# Patient Record
Sex: Male | Born: 1935
Health system: Southern US, Community
[De-identification: ages and names within clinical notes are randomized; demographics above are authoritative.]

## PROBLEM LIST (undated history)

## (undated) DIAGNOSIS — E785 Hyperlipidemia, unspecified: Secondary | ICD-10-CM

## (undated) DIAGNOSIS — I350 Nonrheumatic aortic (valve) stenosis: Secondary | ICD-10-CM

## (undated) DIAGNOSIS — I5032 Chronic diastolic (congestive) heart failure: Secondary | ICD-10-CM

## (undated) DIAGNOSIS — I10 Essential (primary) hypertension: Secondary | ICD-10-CM

## (undated) DIAGNOSIS — I2510 Atherosclerotic heart disease of native coronary artery without angina pectoris: Secondary | ICD-10-CM

## (undated) HISTORY — DX: Atherosclerotic heart disease of native coronary artery without angina pectoris: I25.10

## (undated) HISTORY — DX: Hyperlipidemia, unspecified: E78.5

## (undated) HISTORY — PX: BACK SURGERY: SHX140

## (undated) HISTORY — DX: Essential (primary) hypertension: I10

## (undated) HISTORY — PX: CHOLECYSTECTOMY: SHX55

## (undated) HISTORY — DX: Nonrheumatic aortic (valve) stenosis: I35.0

---

## 2002-07-10 ENCOUNTER — Emergency Department (HOSPITAL_COMMUNITY): Admission: EM | Admit: 2002-07-10 | Discharge: 2002-07-10 | Payer: Self-pay | Admitting: Emergency Medicine

## 2002-09-05 ENCOUNTER — Encounter: Payer: Self-pay | Admitting: Neurosurgery

## 2002-09-07 ENCOUNTER — Inpatient Hospital Stay (HOSPITAL_COMMUNITY): Admission: RE | Admit: 2002-09-07 | Discharge: 2002-09-08 | Payer: Self-pay | Admitting: Neurosurgery

## 2002-09-07 ENCOUNTER — Encounter: Payer: Self-pay | Admitting: Neurosurgery

## 2009-02-28 ENCOUNTER — Encounter: Payer: Self-pay | Admitting: Cardiology

## 2009-03-02 ENCOUNTER — Encounter: Payer: Self-pay | Admitting: Cardiology

## 2009-03-05 ENCOUNTER — Encounter: Payer: Self-pay | Admitting: Cardiology

## 2009-03-07 ENCOUNTER — Ambulatory Visit: Payer: Self-pay | Admitting: Cardiology

## 2009-03-07 ENCOUNTER — Encounter: Payer: Self-pay | Admitting: Cardiology

## 2009-03-07 ENCOUNTER — Inpatient Hospital Stay (HOSPITAL_COMMUNITY): Admission: AD | Admit: 2009-03-07 | Discharge: 2009-03-20 | Payer: Self-pay | Admitting: Cardiology

## 2009-03-07 DIAGNOSIS — I10 Essential (primary) hypertension: Secondary | ICD-10-CM

## 2009-03-07 DIAGNOSIS — I359 Nonrheumatic aortic valve disorder, unspecified: Secondary | ICD-10-CM

## 2009-03-07 DIAGNOSIS — I209 Angina pectoris, unspecified: Secondary | ICD-10-CM

## 2009-03-07 HISTORY — PX: CARDIAC CATHETERIZATION: SHX172

## 2009-03-08 ENCOUNTER — Ambulatory Visit: Payer: Self-pay | Admitting: Surgery

## 2009-03-08 ENCOUNTER — Encounter: Payer: Self-pay | Admitting: Cardiology

## 2009-03-08 ENCOUNTER — Encounter: Payer: Self-pay | Admitting: Surgery

## 2009-03-10 ENCOUNTER — Encounter: Payer: Self-pay | Admitting: Cardiology

## 2009-03-14 ENCOUNTER — Encounter: Payer: Self-pay | Admitting: Surgery

## 2009-03-14 HISTORY — PX: AORTIC VALVE REPLACEMENT: SHX41

## 2009-03-14 HISTORY — PX: CORONARY ARTERY BYPASS GRAFT: SHX141

## 2009-03-30 DIAGNOSIS — I2510 Atherosclerotic heart disease of native coronary artery without angina pectoris: Secondary | ICD-10-CM

## 2009-03-30 DIAGNOSIS — E785 Hyperlipidemia, unspecified: Secondary | ICD-10-CM | POA: Insufficient documentation

## 2009-04-02 ENCOUNTER — Encounter: Payer: Self-pay | Admitting: Nurse Practitioner

## 2009-04-02 ENCOUNTER — Ambulatory Visit: Payer: Self-pay | Admitting: Internal Medicine

## 2009-04-09 ENCOUNTER — Ambulatory Visit: Payer: Self-pay | Admitting: Surgery

## 2009-04-09 ENCOUNTER — Encounter: Admission: RE | Admit: 2009-04-09 | Discharge: 2009-04-09 | Payer: Self-pay | Admitting: Surgery

## 2009-04-09 ENCOUNTER — Encounter: Payer: Self-pay | Admitting: Cardiology

## 2009-04-24 ENCOUNTER — Ambulatory Visit: Payer: Self-pay | Admitting: Surgery

## 2009-04-24 ENCOUNTER — Encounter: Admission: RE | Admit: 2009-04-24 | Discharge: 2009-04-24 | Payer: Self-pay | Admitting: Surgery

## 2009-04-26 ENCOUNTER — Encounter: Payer: Self-pay | Admitting: Cardiology

## 2009-04-27 ENCOUNTER — Encounter: Payer: Self-pay | Admitting: Cardiology

## 2009-05-07 ENCOUNTER — Telehealth: Payer: Self-pay | Admitting: Cardiology

## 2009-05-23 ENCOUNTER — Encounter: Payer: Self-pay | Admitting: Cardiology

## 2009-07-04 ENCOUNTER — Encounter: Payer: Self-pay | Admitting: Cardiology

## 2009-08-06 ENCOUNTER — Encounter: Payer: Self-pay | Admitting: Cardiology

## 2009-09-03 ENCOUNTER — Ambulatory Visit: Payer: Self-pay | Admitting: Cardiology

## 2009-09-03 DIAGNOSIS — I498 Other specified cardiac arrhythmias: Secondary | ICD-10-CM

## 2009-09-04 ENCOUNTER — Encounter (INDEPENDENT_AMBULATORY_CARE_PROVIDER_SITE_OTHER): Payer: Self-pay | Admitting: *Deleted

## 2009-09-17 ENCOUNTER — Ambulatory Visit: Payer: Self-pay

## 2009-09-17 ENCOUNTER — Ambulatory Visit: Payer: Self-pay | Admitting: Cardiology

## 2009-09-17 ENCOUNTER — Encounter: Payer: Self-pay | Admitting: Cardiology

## 2009-09-17 ENCOUNTER — Ambulatory Visit (HOSPITAL_COMMUNITY): Admission: RE | Admit: 2009-09-17 | Discharge: 2009-09-17 | Payer: Self-pay | Admitting: Cardiology

## 2010-03-04 ENCOUNTER — Ambulatory Visit: Payer: Self-pay | Admitting: Cardiology

## 2010-03-05 ENCOUNTER — Encounter (INDEPENDENT_AMBULATORY_CARE_PROVIDER_SITE_OTHER): Payer: Self-pay | Admitting: *Deleted

## 2010-03-28 ENCOUNTER — Ambulatory Visit: Payer: Self-pay

## 2010-03-28 ENCOUNTER — Ambulatory Visit: Payer: Self-pay | Admitting: Cardiovascular Disease

## 2010-03-28 ENCOUNTER — Ambulatory Visit (HOSPITAL_COMMUNITY): Admission: RE | Admit: 2010-03-28 | Discharge: 2010-03-28 | Payer: Self-pay | Admitting: Cardiology

## 2010-03-28 ENCOUNTER — Encounter: Payer: Self-pay | Admitting: Cardiology

## 2010-06-08 IMAGING — CR DG CHEST 1V PORT
1 series · 1 of 1 positions shown · non-contrast
Comparison: None available.

CLINICAL DATA: Unstable angina.

PORTABLE CHEST - 1 VIEW

[AP]
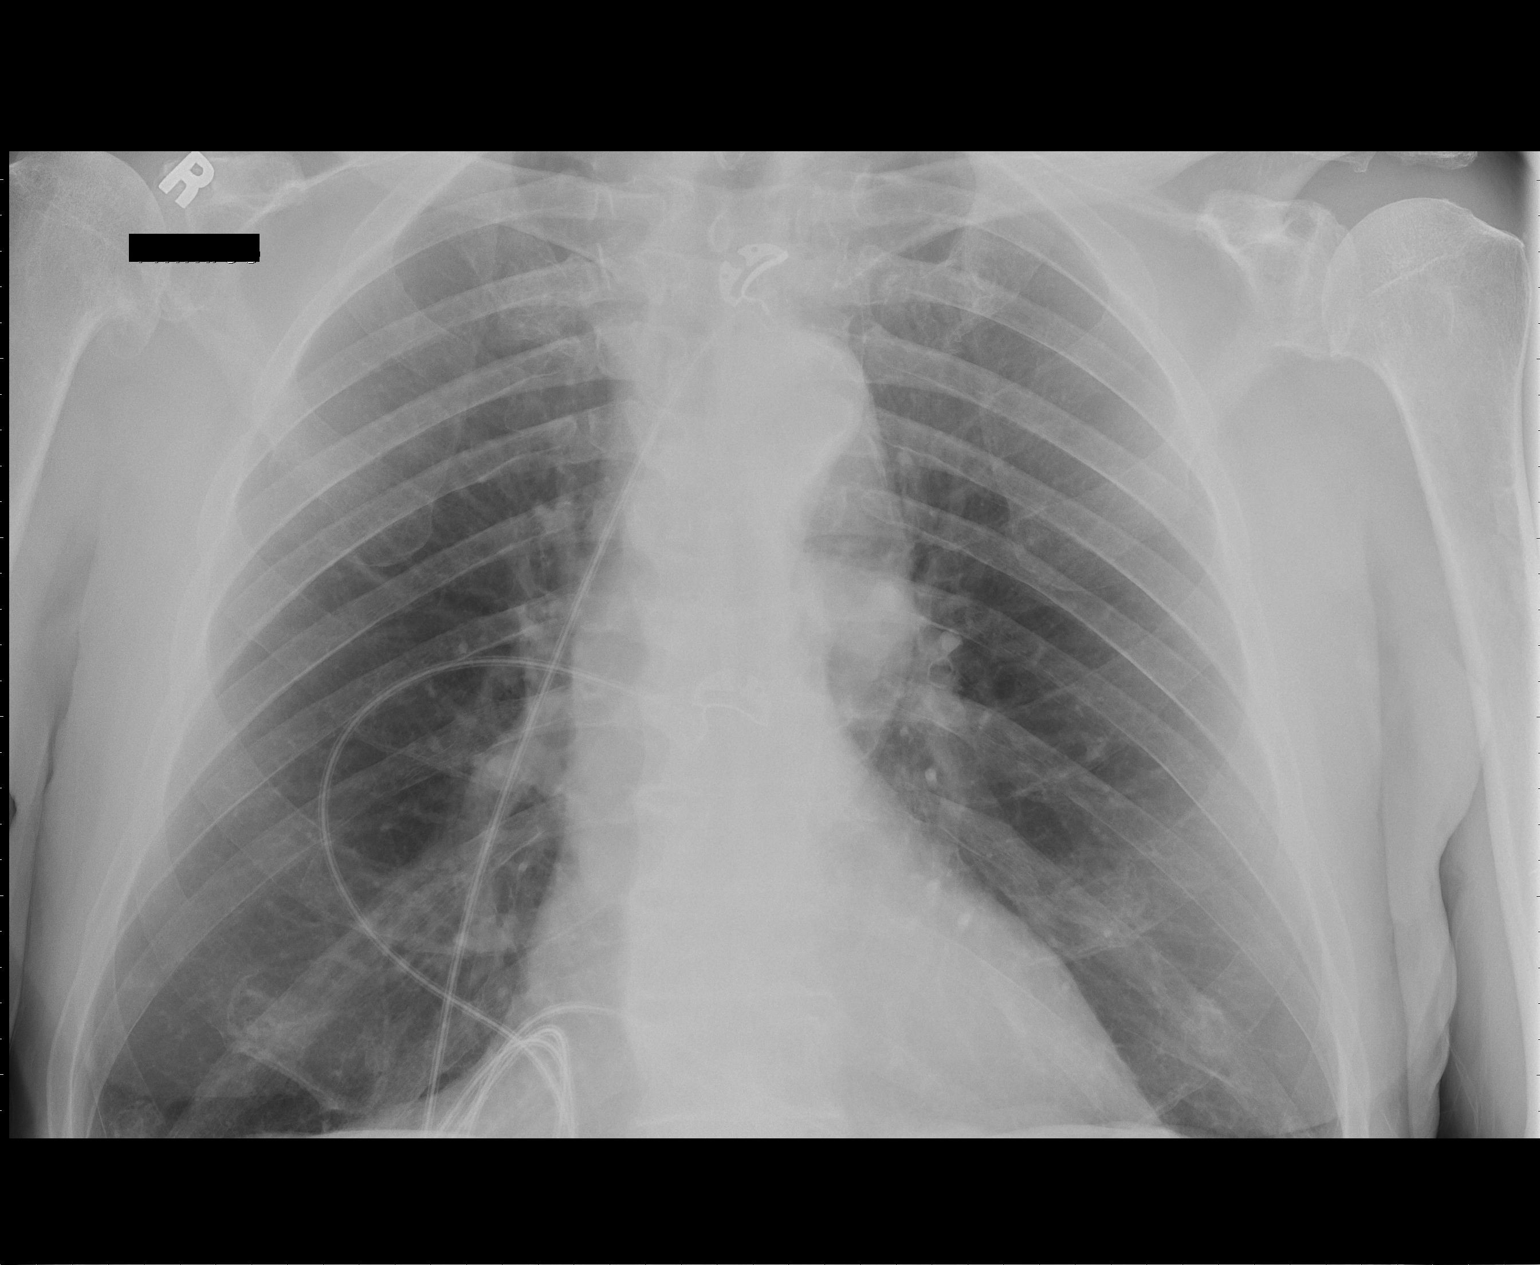

[1 of 1 positions shown; findings below may reference images not displayed]

FINDINGS: Cardiopericardial silhouette is within normal limits for
size.  Mild bronchitic changes are evident in the lung.  No focal
airspace disease is present.  There is no edema or effusion to
suggest failure.  Mild atherosclerotic calcifications are noted at
the aortic arch.  Degenerative changes are noted in the right
greater than left shoulder.
IMPRESSION: 1.  No acute cardiopulmonary disease.
2.  Mild bronchitic changes are evident.
3.  Degenerative changes of the shoulder, right greater than left.

## 2010-06-11 IMAGING — CR DG CHEST 2V
2 series · 2 of 2 positions shown · non-contrast
Comparison: 03/07/2009.

CLINICAL DATA: Unstable angina.

CHEST - 2 VIEW

[w chest pa]
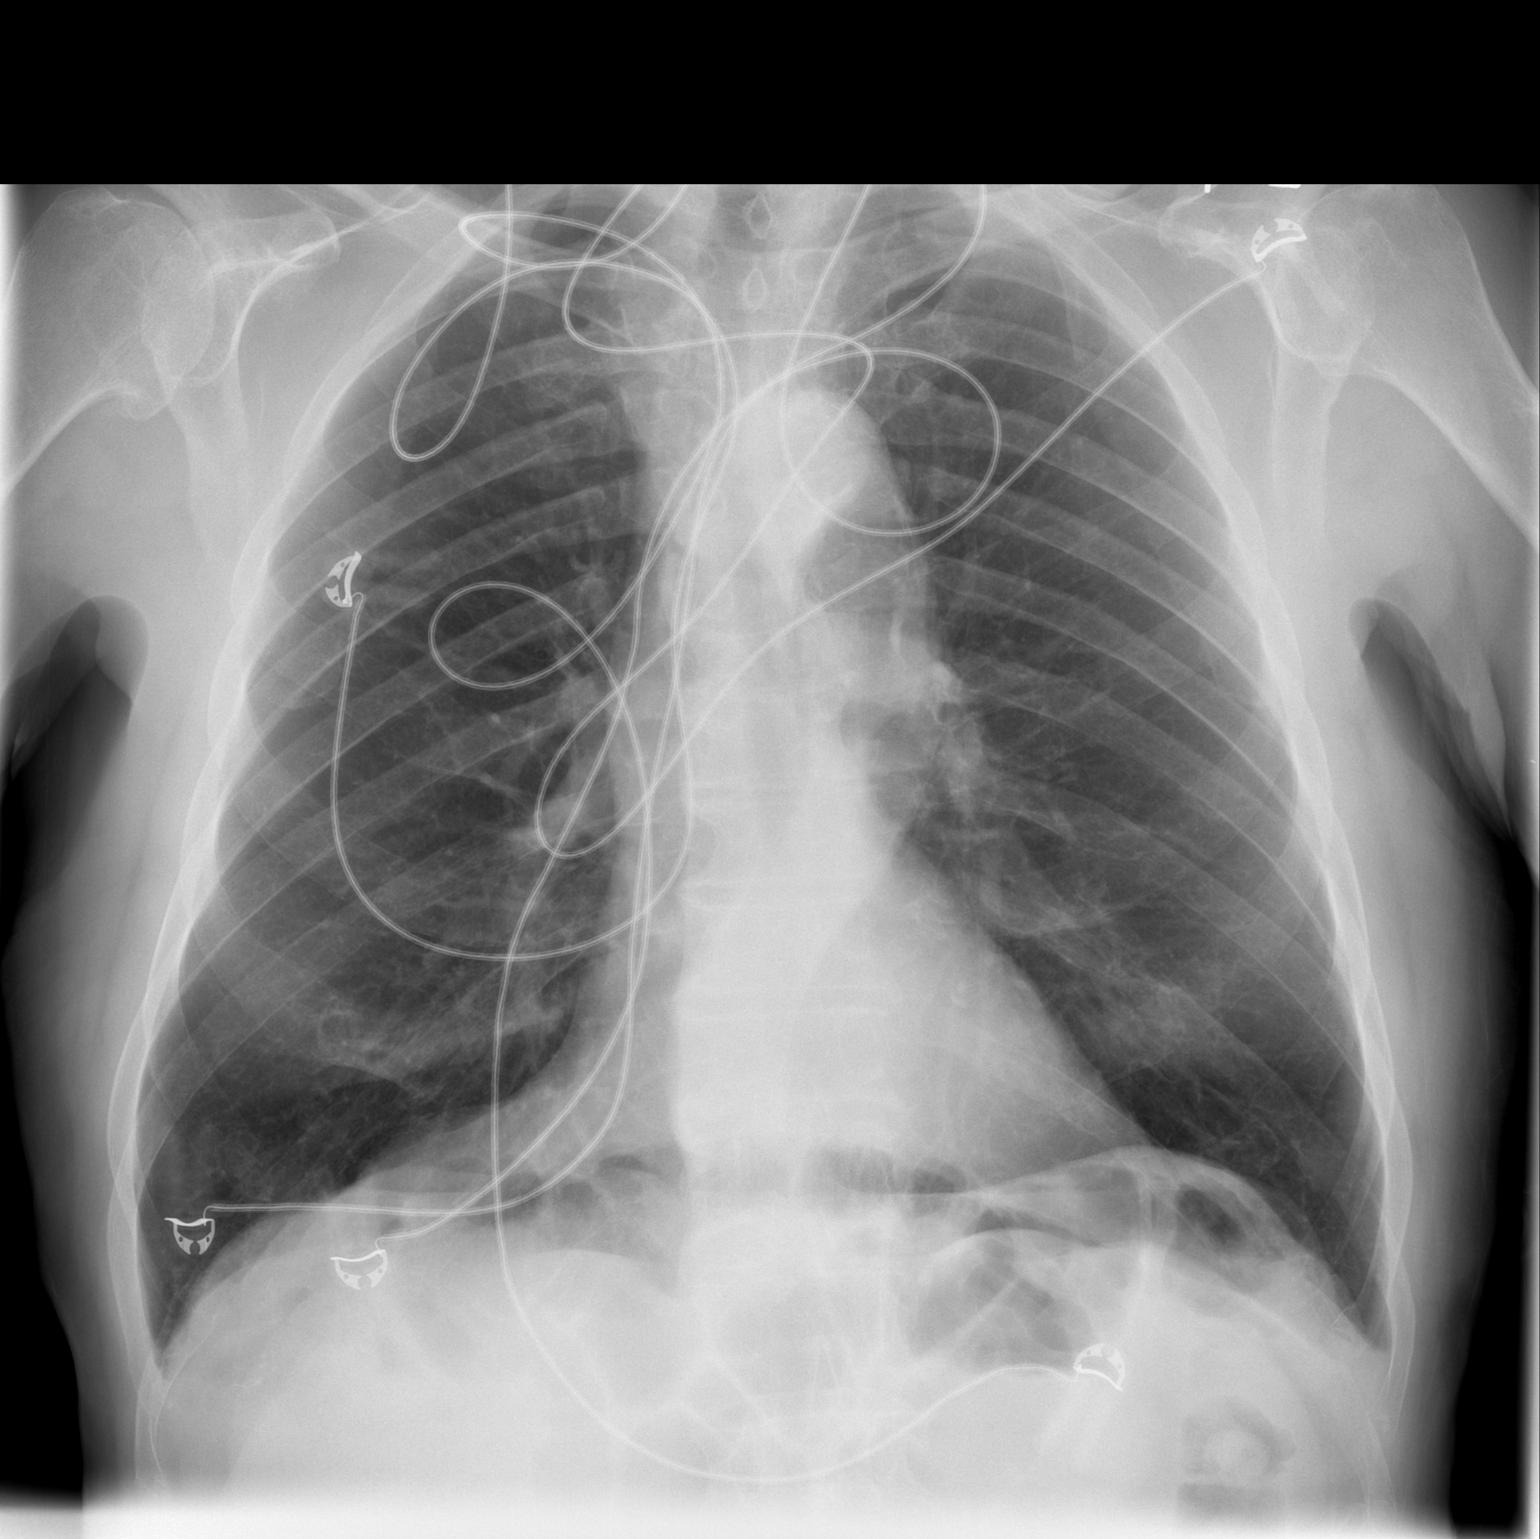

[w chest lat]
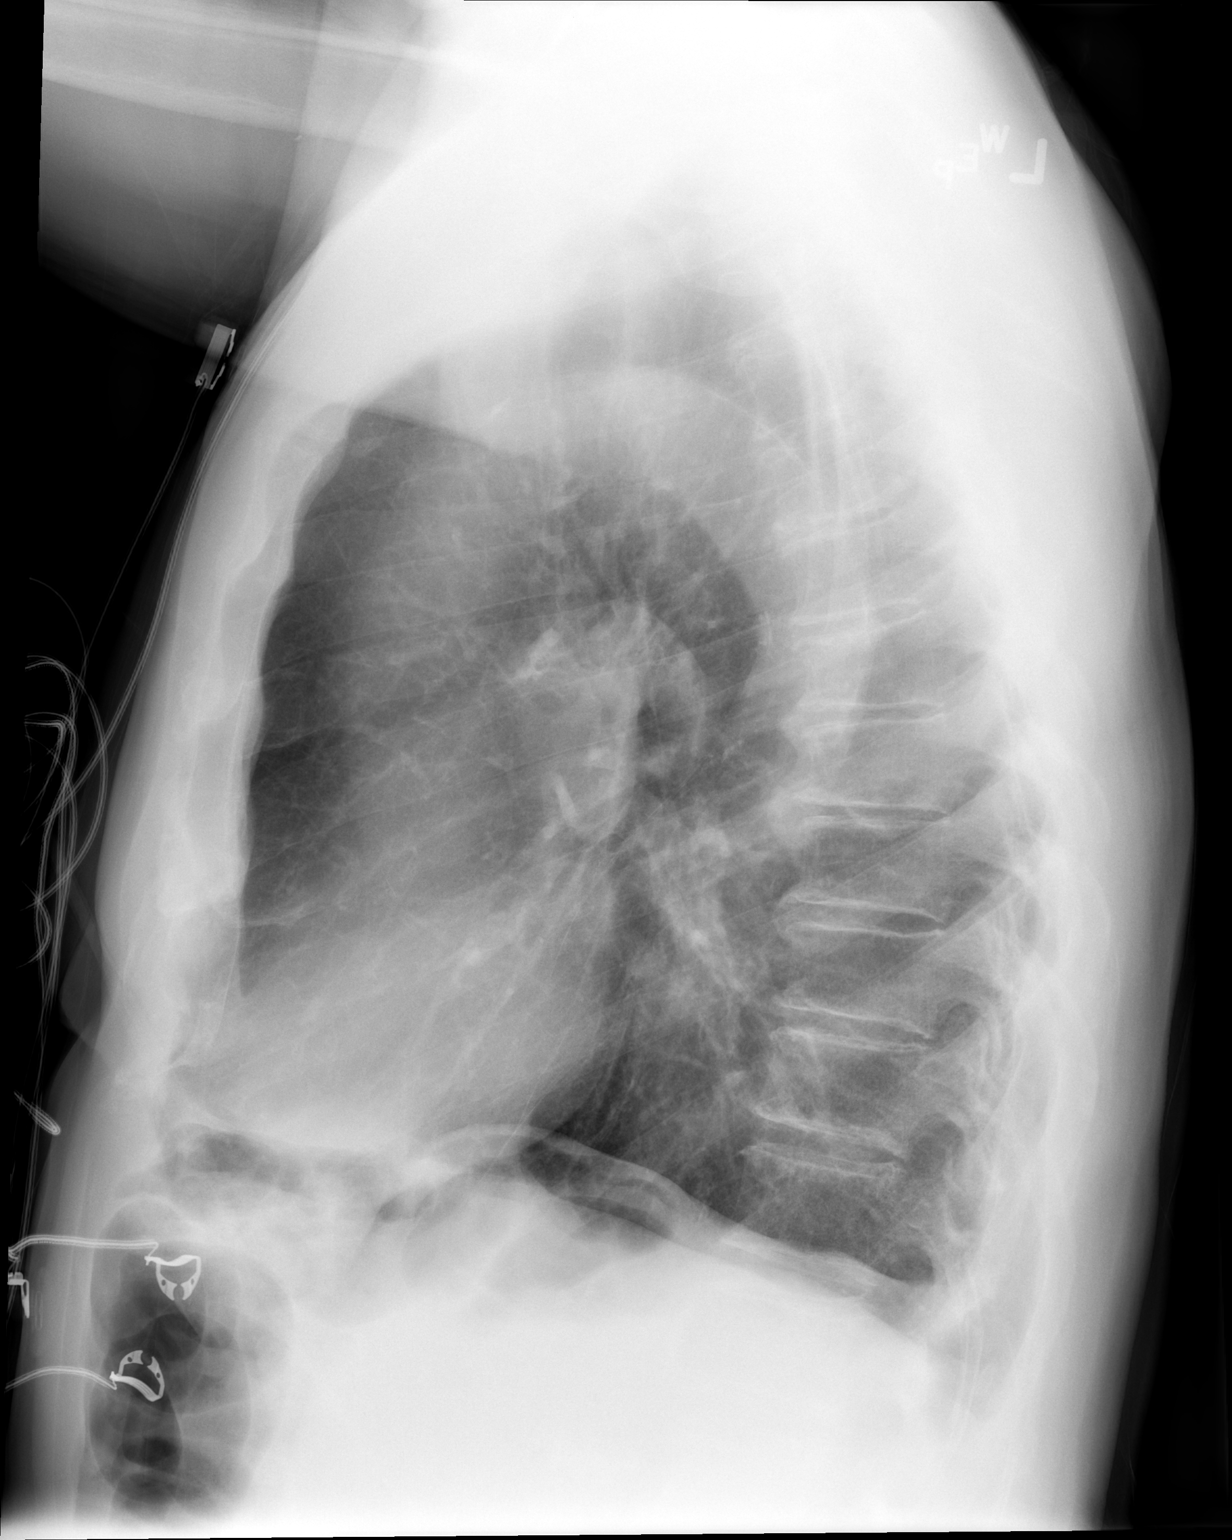

[2 of 2 positions shown; findings below may reference images not displayed]

FINDINGS: The lungs are hyperinflated with changes of COPD.  No
acute infiltrate or effusion.  Negative for lung mass.
IMPRESSION: COPD.  No acute cardiopulmonary disease.

## 2010-09-25 ENCOUNTER — Encounter
Admission: RE | Admit: 2010-09-25 | Discharge: 2010-09-25 | Payer: Self-pay | Source: Home / Self Care | Attending: Neurosurgery | Admitting: Neurosurgery

## 2010-10-04 ENCOUNTER — Encounter
Admission: RE | Admit: 2010-10-04 | Discharge: 2010-10-04 | Payer: Self-pay | Source: Home / Self Care | Attending: Surgery | Admitting: Surgery

## 2010-11-10 LAB — CONVERTED CEMR LAB
ALT: 23 units/L (ref 0–53)
Albumin: 4.2 g/dL (ref 3.5–5.2)
Alkaline Phosphatase: 74 units/L (ref 39–117)
Alkaline Phosphatase: 75 units/L (ref 39–117)
BUN: 11 mg/dL (ref 6–23)
Bilirubin, Direct: 0.1 mg/dL (ref 0.0–0.3)
CO2: 32 meq/L (ref 19–32)
Calcium: 9.1 mg/dL (ref 8.4–10.5)
Chloride: 104 meq/L (ref 96–112)
Creatinine, Ser: 1.2 mg/dL (ref 0.4–1.5)
GFR calc non Af Amer: 61.98 mL/min (ref >60)
Glucose, Bld: 99 mg/dL (ref 70–99)
LDL Cholesterol: 63 mg/dL (ref 0–99)
Potassium: 4.7 meq/L (ref 3.5–5.1)
Sodium: 142 meq/L (ref 135–145)
Total Bilirubin: 1.1 mg/dL (ref 0.3–1.2)
Total CHOL/HDL Ratio: 3
Triglycerides: 60 mg/dL (ref 0.0–149.0)
VLDL: 18.6 mg/dL (ref 0.0–40.0)

## 2010-11-12 NOTE — Letter (Signed)
Summary: Custom - Lipid  [facility], Main Office  [address]   [place], [place] [address]   Phone: [phone]  Fax: [phone]     Mar 05, 2010 MRN: [id number]   Terry Barrett [address] [place], [place]  [address]   Dear Mr. Jutras,  We have reviewed your cholesterol results.  They are as follows:     Total Cholesterol:    120 (Desirable: less than 200)       HDL  Cholesterol:     45.00  (Desirable: greater than 40 for men and 50 for women)       LDL Cholesterol:       63  (Desirable: less than 100 for low risk and less than 70 for moderate to high risk)       Triglycerides:       60.0  (Desirable: less than 150)  Our recommendations include:These numbers look good. Continue on the same medicine. Sodium, potassium, kidney and Liver function are normal. Take care, Dr. Darel Hong.    Call our office at the number listed above if you have any questions.  Lowering your LDL cholesterol is important, but it is only one of a large number of "risk factors" that may indicate that you are at risk for heart disease, stroke or other complications of hardening of the arteries.  Other risk factors include:   A.  Cigarette Smoking* B.  High Blood Pressure* C.  Obesity* D.   Low HDL Cholesterol (see yours above)* E.   Diabetes Mellitus (higher risk if your is uncontrolled) F.  Family history of premature heart disease G.  Previous history of stroke or cardiovascular disease    *These are risk factors YOU HAVE CONTROL OVER.  For more information, visit .  There is now evidence that lowering the TOTAL CHOLESTEROL AND LDL CHOLESTEROL can reduce the risk of heart disease.  The American Heart Association recommends the following guidelines for the treatment of elevated cholesterol:  1.  If there is now current heart disease and less than two risk factors, TOTAL CHOLESTEROL should be less than 200 and LDL CHOLESTEROL should be less than 100. 2.  If there  is current heart disease or two or more risk factors, TOTAL CHOLESTEROL should be less than 200 and LDL CHOLESTEROL should be less than 70.  A diet low in cholesterol, saturated fat, and calories is the cornerstone of treatment for elevated cholesterol.  Cessation of smoking and exercise are also important in the management of elevated cholesterol and preventing vascular disease.  Studies have shown that 30 to 60 minutes of physical activity most days can help lower blood pressure, lower cholesterol, and keep your weight at a healthy level.  Drug therapy is used when cholesterol levels do not respond to therapeutic lifestyle changes (smoking cessation, diet, and exercise) and remains unacceptably high.  If medication is started, it is important to have you levels checked periodically to evaluate the need for further treatment options.  Thank you,    [employer] Team

## 2010-11-12 NOTE — Assessment & Plan Note (Signed)
Summary: 6 mo f/u   Primary Provider:  Monico Hoar   History of Present Illness: Pleasant gentleman with h/o exertional angina s/p abnl myoview leading to cath showing 3vd and Mod. Ao Stenosis.  He subsequently underwent coronary artery bypass grafting x3 as well as aortic valve replacement with a pericardial tissue valve in May of 2010. I last saw him in November of 2000. Since then the patient denies any dyspnea on exertion, orthopnea, PND, pedal edema, palpitations, syncope or chest pain.   Current Medications (verified): 1)  Amitriptyline Hcl 25 Mg Tabs (Amitriptyline Hcl) .Marland Kitchen.. 1 Tab By Mouth Once Daily 2)  Aspirin 325 Mg  Tabs (Aspirin) .Marland Kitchen.. 1 Tab By Mouth Once Daily 3)  Multivitamins   Tabs (Multiple Vitamin) .Marland Kitchen.. 1 Tab By Mouth Once Daily 4)  Metoprolol Tartrate 25 Mg Tabs (Metoprolol Tartrate) .... Take One Half  Tablet By Mouth Twice A Day 5)  Simvastatin 40 Mg Tabs (Simvastatin) .... Once Daily 6)  Lisinopril 5 Mg Tabs (Lisinopril) .... Once Daily 7)  Nitroglycerin 0.4 Mg Subl (Nitroglycerin) .... One Tablet Under Tongue Every 5 Minutes As Needed For Chest Pain---May Repeat Times Three  Past History:  Past Medical History: Reviewed history from 09/03/2009 and no changes required. CAD (ICD-414.00)      a.  02/2009 - myoview:  Mod. inferapical ischemia      b.  03/07/09 - cath:  3vd.  EF 65%, 30-25mmHg peak Ao Valve gradietn      c.  03/14/2009 - CABG x 3:  LIMA - LAD; SVG - Diag; SVG - RCA ESSENTIAL HYPERTENSION, BENIGN (ICD-401.1) HYPERLIPIDEMIA (ICD-272.4) AORTIC STENOSIS      a.  03/14/2009 -  Aortic valve replacement using a 23- mm Edwards pericardial valve  Past Surgical History: Hernia surgeryx3 Cholecystectomy Back surgery coronary artery bypass grafting aortic valve replacement   Social History: Reviewed history from 03/30/2009 and no changes required. He is married and lives with his wife.  He is retired.  He is a previous smoker, but quit in his 46s.  Denies  alcohol use.   Review of Systems       Some arthralgias but no fevers or chills, productive cough, hemoptysis, dysphasia, odynophagia, melena, hematochezia, dysuria, hematuria, rash, seizure activity, orthopnea, PND, pedal edema, claudication. Remaining systems are negative.   Vital Signs:  Patient profile:   77 year old male Height:      68 inches Weight:      164 pounds BMI:     25.03 Pulse rate:   59 / minute Resp:     12 per minute BP sitting:   144 / 78  (left arm)  Vitals Entered By: Kem Parkinson (Mar 04, 2010 8:34 AM)  Physical Exam  General:  Well-developed well-nourished in no acute distress.  Skin is warm and dry.  HEENT is normal.  Neck is supple. No thyromegaly.  Chest is clear to auscultation with normal expansion.  Cardiovascular exam is regular rate and rhythm. 2/6 systolic murmur left sternal border. No diastolic murmur noted. Abdominal exam nontender or distended. No masses palpated. Extremities show no edema. neuro grossly intact    EKG  Procedure date:  03/04/2010  Findings:      Sinus bradycardia at a rate of 59. Axis normal. No ST changes.  Impression & Recommendations:  Problem # 1:  AORTIC STENOSIS (ICD-424.1)  Status post aortic valve replacement. Continued SBE prophylaxis. Check echocardiogram. His updated medication list for this problem includes:    Metoprolol Tartrate  25 Mg Tabs (Metoprolol tartrate) .Marland Kitchen... Take one half  tablet by mouth twice a day    Lisinopril 5 Mg Tabs (Lisinopril) ..... Once daily    Nitroglycerin 0.4 Mg Subl (Nitroglycerin) ..... One tablet under tongue every 5 minutes as needed for chest pain---may repeat times three  Orders: EKG w/ Interpretation (93000) TLB-Lipid Panel (80061-LIPID) TLB-Hepatic/Liver Function Pnl (80076-HEPATIC)  Problem # 2:  CAD (ICD-414.00)  Continue aspirin, beta blocker, ACE inhibitor and statin. His updated medication list for this problem includes:    Aspirin 325 Mg Tabs  (Aspirin) .Marland Kitchen... 1 tab by mouth once daily    Metoprolol Tartrate 25 Mg Tabs (Metoprolol tartrate) .Marland Kitchen... Take one half  tablet by mouth twice a day    Lisinopril 5 Mg Tabs (Lisinopril) ..... Once daily    Nitroglycerin 0.4 Mg Subl (Nitroglycerin) ..... One tablet under tongue every 5 minutes as needed for chest pain---may repeat times three  His updated medication list for this problem includes:    Aspirin 325 Mg Tabs (Aspirin) .Marland Kitchen... 1 tab by mouth once daily    Metoprolol Tartrate 25 Mg Tabs (Metoprolol tartrate) .Marland Kitchen... Take one half  tablet by mouth twice a day    Lisinopril 5 Mg Tabs (Lisinopril) ..... Once daily    Nitroglycerin 0.4 Mg Subl (Nitroglycerin) ..... One tablet under tongue every 5 minutes as needed for chest pain---may repeat times three  Orders: EKG w/ Interpretation (93000) TLB-Lipid Panel (80061-LIPID) TLB-Hepatic/Liver Function Pnl (80076-HEPATIC) TLB-BMP (Basic Metabolic Panel-BMET) (80048-METABOL)  Problem # 3:  ESSENTIAL HYPERTENSION, BENIGN (ICD-401.1) Blood pressure mildly elevated but he has not taken his medication this morning. He states this is not typically is 120-125. Continue present medications. Check bmet. His updated medication list for this problem includes:    Aspirin 325 Mg Tabs (Aspirin) .Marland Kitchen... 1 tab by mouth once daily    Metoprolol Tartrate 25 Mg Tabs (Metoprolol tartrate) .Marland Kitchen... Take one half  tablet by mouth twice a day    Lisinopril 5 Mg Tabs (Lisinopril) ..... Once daily  His updated medication list for this problem includes:    Aspirin 325 Mg Tabs (Aspirin) .Marland Kitchen... 1 tab by mouth once daily    Metoprolol Tartrate 25 Mg Tabs (Metoprolol tartrate) .Marland Kitchen... Take one half  tablet by mouth twice a day    Lisinopril 5 Mg Tabs (Lisinopril) ..... Once daily  Problem # 4:  HYPERLIPIDEMIA (ICD-272.4) Continue statin. Check lipids and liver. His updated medication list for this problem includes:    Simvastatin 40 Mg Tabs (Simvastatin) ..... Once daily  His  updated medication list for this problem includes:    Simvastatin 40 Mg Tabs (Simvastatin) ..... Once daily  Other Orders: Echocardiogram (Echo)  Patient Instructions: 1)  Your physician recommends that you schedule a follow-up appointment in: 1year with Dr. Jens Som 2)  Your physician recommends that you continue on your current medications as directed. Please refer to the Current Medication list given to you today. 3)  Your physician has requested that you have an echocardiogram.  Echocardiography is a painless test that uses sound waves to create images of your heart. It provides your doctor with information about the size and shape of your heart and how well your heart's chambers and valves are working.  This procedure takes approximately one hour. There are no restrictions for this procedure. 4)  Your physician recommends that you have lab work today: BMET, Lipid, Liver Prescriptions: NITROGLYCERIN 0.4 MG SUBL (NITROGLYCERIN) One tablet under tongue every 5 minutes as needed  for chest pain---may repeat times three  #25 x 3   Entered by:   Kem Parkinson   Authorized by:   Ferman Hamming, MD, [facility]   Signed by:   Kem Parkinson on 03/04/2010   Method used:   Electronically to        [facility]* (retail)       [address]       [place], [place]  [address]       Ph: [phone]       Fax: [phone]   RxID:   [id number]

## 2011-01-20 LAB — POCT I-STAT 3, ART BLOOD GAS (G3+)
Acid-base deficit: 1 mmol/L (ref 0.0–2.0)
Acid-base deficit: 2 mmol/L (ref 0.0–2.0)
Acid-base deficit: 2 mmol/L (ref 0.0–2.0)
Bicarbonate: 20.9 mEq/L (ref 20.0–24.0)
Bicarbonate: 22.7 mEq/L (ref 20.0–24.0)
Bicarbonate: 23.3 mEq/L (ref 20.0–24.0)
O2 Saturation: 100 %
O2 Saturation: 98 %
Patient temperature: 36.1
Patient temperature: 37.8
TCO2: 22 mmol/L (ref 0–100)
TCO2: 24 mmol/L (ref 0–100)
pCO2 arterial: 31.2 mmHg — ABNORMAL LOW (ref 35.0–45.0)
pCO2 arterial: 38.9 mmHg (ref 35.0–45.0)
pH, Arterial: 7.431 (ref 7.350–7.450)
pO2, Arterial: 103 mmHg — ABNORMAL HIGH (ref 80.0–100.0)
pO2, Arterial: 191 mmHg — ABNORMAL HIGH (ref 80.0–100.0)
pO2, Arterial: 369 mmHg — ABNORMAL HIGH (ref 80.0–100.0)
pO2, Arterial: 87 mmHg (ref 80.0–100.0)

## 2011-01-20 LAB — POCT I-STAT GLUCOSE: Glucose, Bld: 97 mg/dL (ref 70–99)

## 2011-01-20 LAB — POCT I-STAT, CHEM 8
BUN: 11 mg/dL (ref 6–23)
BUN: 12 mg/dL (ref 6–23)
Calcium, Ion: 1.18 mmol/L (ref 1.12–1.32)
Chloride: 106 mEq/L (ref 96–112)
Glucose, Bld: 110 mg/dL — ABNORMAL HIGH (ref 70–99)
Sodium: 136 mEq/L (ref 135–145)
TCO2: 21 mmol/L (ref 0–100)

## 2011-01-20 LAB — CBC
HCT: 22.9 % — ABNORMAL LOW (ref 39.0–52.0)
HCT: 24.5 % — ABNORMAL LOW (ref 39.0–52.0)
HCT: 28.9 % — ABNORMAL LOW (ref 39.0–52.0)
HCT: 31.2 % — ABNORMAL LOW (ref 39.0–52.0)
HCT: 37 % — ABNORMAL LOW (ref 39.0–52.0)
HCT: 39.2 % (ref 39.0–52.0)
Hemoglobin: 12.9 g/dL — ABNORMAL LOW (ref 13.0–17.0)
Hemoglobin: 8 g/dL — ABNORMAL LOW (ref 13.0–17.0)
MCHC: 34.4 g/dL (ref 30.0–36.0)
MCHC: 34.8 g/dL (ref 30.0–36.0)
MCHC: 35 g/dL (ref 30.0–36.0)
MCV: 100.6 fL — ABNORMAL HIGH (ref 78.0–100.0)
MCV: 100.9 fL — ABNORMAL HIGH (ref 78.0–100.0)
MCV: 101.8 fL — ABNORMAL HIGH (ref 78.0–100.0)
MCV: 102.1 fL — ABNORMAL HIGH (ref 78.0–100.0)
MCV: 103.3 fL — ABNORMAL HIGH (ref 78.0–100.0)
MCV: 104.6 fL — ABNORMAL HIGH (ref 78.0–100.0)
Platelets: 88 10*3/uL — ABNORMAL LOW (ref 150–400)
Platelets: 97 10*3/uL — ABNORMAL LOW (ref 150–400)
RBC: 2.33 MIL/uL — ABNORMAL LOW (ref 4.22–5.81)
RBC: 2.4 MIL/uL — ABNORMAL LOW (ref 4.22–5.81)
RBC: 2.49 MIL/uL — ABNORMAL LOW (ref 4.22–5.81)
RBC: 2.87 MIL/uL — ABNORMAL LOW (ref 4.22–5.81)
RBC: 3.75 MIL/uL — ABNORMAL LOW (ref 4.22–5.81)
RDW: 13.3 % (ref 11.5–15.5)
RDW: 13.4 % (ref 11.5–15.5)
RDW: 13.6 % (ref 11.5–15.5)
WBC: 7 10*3/uL (ref 4.0–10.5)
WBC: 7.3 10*3/uL (ref 4.0–10.5)
WBC: 7.8 10*3/uL (ref 4.0–10.5)
WBC: 8.3 10*3/uL (ref 4.0–10.5)
WBC: 9.2 10*3/uL (ref 4.0–10.5)

## 2011-01-20 LAB — POCT I-STAT 3, VENOUS BLOOD GAS (G3P V)
O2 Saturation: 95 %
pCO2, Ven: 46.4 mmHg (ref 45.0–50.0)
pH, Ven: 7.314 — ABNORMAL HIGH (ref 7.250–7.300)
pO2, Ven: 83 mmHg — ABNORMAL HIGH (ref 30.0–45.0)

## 2011-01-20 LAB — BASIC METABOLIC PANEL
BUN: 15 mg/dL (ref 6–23)
BUN: 16 mg/dL (ref 6–23)
BUN: 16 mg/dL (ref 6–23)
CO2: 29 mEq/L (ref 19–32)
CO2: 29 mEq/L (ref 19–32)
CO2: 31 mEq/L (ref 19–32)
CO2: 31 mEq/L (ref 19–32)
Calcium: 7.7 mg/dL — ABNORMAL LOW (ref 8.4–10.5)
Calcium: 8.6 mg/dL (ref 8.4–10.5)
Calcium: 9.2 mg/dL (ref 8.4–10.5)
Chloride: 101 mEq/L (ref 96–112)
Chloride: 104 mEq/L (ref 96–112)
Chloride: 108 mEq/L (ref 96–112)
Creatinine, Ser: 1.05 mg/dL (ref 0.4–1.5)
Creatinine, Ser: 1.15 mg/dL (ref 0.4–1.5)
Creatinine, Ser: 1.18 mg/dL (ref 0.4–1.5)
Creatinine, Ser: 1.18 mg/dL (ref 0.4–1.5)
Creatinine, Ser: 1.23 mg/dL (ref 0.4–1.5)
GFR calc Af Amer: >60 mL/min (ref >60)
GFR calc Af Amer: >60 mL/min (ref >60)
GFR calc Af Amer: >60 mL/min (ref >60)
GFR calc Af Amer: >60 mL/min (ref >60)
GFR calc non Af Amer: 60 mL/min — ABNORMAL LOW (ref >60)
Glucose, Bld: 97 mg/dL (ref 70–99)
Potassium: 4 mEq/L (ref 3.5–5.1)
Potassium: 4.5 mEq/L (ref 3.5–5.1)
Sodium: 140 mEq/L (ref 135–145)
Sodium: 141 mEq/L (ref 135–145)

## 2011-01-20 LAB — PREPARE PLATELETS

## 2011-01-20 LAB — URINALYSIS, MICROSCOPIC ONLY
Glucose, UA: NEGATIVE mg/dL
Hgb urine dipstick: NEGATIVE
Ketones, ur: NEGATIVE mg/dL
Protein, ur: NEGATIVE mg/dL

## 2011-01-20 LAB — PLATELET COUNT: Platelets: 102 10*3/uL — ABNORMAL LOW (ref 150–400)

## 2011-01-20 LAB — GLUCOSE, CAPILLARY
Glucose-Capillary: 132 mg/dL — ABNORMAL HIGH (ref 70–99)
Glucose-Capillary: 72 mg/dL (ref 70–99)
Glucose-Capillary: 87 mg/dL (ref 70–99)

## 2011-01-20 LAB — POCT I-STAT 4, (NA,K, GLUC, HGB,HCT)
Glucose, Bld: 89 mg/dL (ref 70–99)
Glucose, Bld: 98 mg/dL (ref 70–99)
HCT: 22 % — ABNORMAL LOW (ref 39.0–52.0)
HCT: 28 % — ABNORMAL LOW (ref 39.0–52.0)
HCT: 39 % (ref 39.0–52.0)
Hemoglobin: 10.2 g/dL — ABNORMAL LOW (ref 13.0–17.0)
Hemoglobin: 7.5 g/dL — CL (ref 13.0–17.0)
Hemoglobin: 9.5 g/dL — ABNORMAL LOW (ref 13.0–17.0)
Potassium: 3.8 mEq/L (ref 3.5–5.1)
Potassium: 4.6 mEq/L (ref 3.5–5.1)
Potassium: 5.5 mEq/L — ABNORMAL HIGH (ref 3.5–5.1)
Sodium: 131 mEq/L — ABNORMAL LOW (ref 135–145)
Sodium: 133 mEq/L — ABNORMAL LOW (ref 135–145)
Sodium: 137 mEq/L (ref 135–145)

## 2011-01-20 LAB — CROSSMATCH
ABO/RH(D): O POS
Antibody Screen: NEGATIVE

## 2011-01-20 LAB — APTT: aPTT: 50 seconds — ABNORMAL HIGH (ref 24–37)

## 2011-01-20 LAB — MAGNESIUM
Magnesium: 1.9 mg/dL (ref 1.5–2.5)
Magnesium: 2 mg/dL (ref 1.5–2.5)

## 2011-01-20 LAB — CREATININE, SERUM
Creatinine, Ser: 1.14 mg/dL (ref 0.4–1.5)
GFR calc Af Amer: >60 mL/min (ref >60)

## 2011-01-20 LAB — PROTIME-INR
INR: 1.6 — ABNORMAL HIGH (ref 0.00–1.49)
Prothrombin Time: 19.6 seconds — ABNORMAL HIGH (ref 11.6–15.2)

## 2011-01-20 LAB — HEPARIN LEVEL (UNFRACTIONATED): Heparin Unfractionated: 0.46 IU/mL (ref 0.30–0.70)

## 2011-01-20 LAB — HEMOGLOBIN AND HEMATOCRIT, BLOOD
HCT: 27.7 % — ABNORMAL LOW (ref 39.0–52.0)
Hemoglobin: 9.6 g/dL — ABNORMAL LOW (ref 13.0–17.0)

## 2011-01-21 LAB — POCT I-STAT 3, VENOUS BLOOD GAS (G3P V)
Acid-Base Excess: 1 mmol/L (ref 0.0–2.0)
O2 Saturation: 69 %
TCO2: 28 mmol/L (ref 0–100)
pCO2, Ven: 42.1 mmHg — ABNORMAL LOW (ref 45.0–50.0)
pO2, Ven: 36 mmHg (ref 30.0–45.0)

## 2011-01-21 LAB — PROTIME-INR
INR: 1.1 (ref 0.00–1.49)
Prothrombin Time: 15 seconds (ref 11.6–15.2)
Prothrombin Time: 15.5 seconds — ABNORMAL HIGH (ref 11.6–15.2)

## 2011-01-21 LAB — CROSSMATCH
ABO/RH(D): O POS
Antibody Screen: NEGATIVE

## 2011-01-21 LAB — BASIC METABOLIC PANEL
CO2: 30 mEq/L (ref 19–32)
CO2: 30 mEq/L (ref 19–32)
Chloride: 104 mEq/L (ref 96–112)
Chloride: 106 mEq/L (ref 96–112)
GFR calc Af Amer: >60 mL/min (ref >60)
GFR calc Af Amer: >60 mL/min (ref >60)
Glucose, Bld: 94 mg/dL (ref 70–99)
Potassium: 3.8 mEq/L (ref 3.5–5.1)
Sodium: 138 mEq/L (ref 135–145)

## 2011-01-21 LAB — COMPREHENSIVE METABOLIC PANEL
ALT: 16 U/L (ref 0–53)
ALT: 17 U/L (ref 0–53)
Alkaline Phosphatase: 69 U/L (ref 39–117)
BUN: 14 mg/dL (ref 6–23)
BUN: 16 mg/dL (ref 6–23)
CO2: 29 mEq/L (ref 19–32)
CO2: 30 mEq/L (ref 19–32)
Calcium: 9 mg/dL (ref 8.4–10.5)
Calcium: 9.1 mg/dL (ref 8.4–10.5)
Creatinine, Ser: 1.28 mg/dL (ref 0.4–1.5)
GFR calc non Af Amer: 54 mL/min — ABNORMAL LOW (ref >60)
GFR calc non Af Amer: 57 mL/min — ABNORMAL LOW (ref >60)
Glucose, Bld: 89 mg/dL (ref 70–99)
Glucose, Bld: 95 mg/dL (ref 70–99)
Sodium: 139 mEq/L (ref 135–145)
Sodium: 141 mEq/L (ref 135–145)
Total Protein: 6.3 g/dL (ref 6.0–8.3)

## 2011-01-21 LAB — CBC
HCT: 39.6 % (ref 39.0–52.0)
HCT: 40.5 % (ref 39.0–52.0)
Hemoglobin: 12.9 g/dL — ABNORMAL LOW (ref 13.0–17.0)
Hemoglobin: 13.2 g/dL (ref 13.0–17.0)
Hemoglobin: 13.6 g/dL (ref 13.0–17.0)
Hemoglobin: 14 g/dL (ref 13.0–17.0)
MCHC: 34.4 g/dL (ref 30.0–36.0)
MCHC: 34.7 g/dL (ref 30.0–36.0)
MCHC: 35.8 g/dL (ref 30.0–36.0)
MCV: 102.2 fL — ABNORMAL HIGH (ref 78.0–100.0)
MCV: 103.4 fL — ABNORMAL HIGH (ref 78.0–100.0)
Platelets: 142 10*3/uL — ABNORMAL LOW (ref 150–400)
Platelets: 151 10*3/uL (ref 150–400)
Platelets: 155 10*3/uL (ref 150–400)
RBC: 3.66 MIL/uL — ABNORMAL LOW (ref 4.22–5.81)
RBC: 3.81 MIL/uL — ABNORMAL LOW (ref 4.22–5.81)
RBC: 3.81 MIL/uL — ABNORMAL LOW (ref 4.22–5.81)
RBC: 3.92 MIL/uL — ABNORMAL LOW (ref 4.22–5.81)
RDW: 12.9 % (ref 11.5–15.5)
RDW: 13.4 % (ref 11.5–15.5)
RDW: 13.4 % (ref 11.5–15.5)
WBC: 5.9 10*3/uL (ref 4.0–10.5)
WBC: 6 10*3/uL (ref 4.0–10.5)

## 2011-01-21 LAB — CARDIAC PANEL(CRET KIN+CKTOT+MB+TROPI)
CK, MB: 2.2 ng/mL (ref 0.3–4.0)
CK, MB: 2.6 ng/mL (ref 0.3–4.0)
Relative Index: INVALID (ref 0.0–2.5)
Relative Index: INVALID (ref 0.0–2.5)
Total CK: 93 U/L (ref 7–232)
Troponin I: 0.03 ng/mL (ref 0.00–0.06)
Troponin I: 0.1 ng/mL — ABNORMAL HIGH (ref 0.00–0.06)
Troponin I: 0.11 ng/mL — ABNORMAL HIGH (ref 0.00–0.06)

## 2011-01-21 LAB — LIPID PANEL
Cholesterol: 122 mg/dL (ref 0–200)
Cholesterol: 155 mg/dL (ref 0–200)
HDL: 41 mg/dL (ref >39)
HDL: 41 mg/dL (ref >39)
LDL Cholesterol: 71 mg/dL (ref 0–99)
Total CHOL/HDL Ratio: 3 RATIO
VLDL: 10 mg/dL (ref 0–40)

## 2011-01-21 LAB — BLOOD GAS, ARTERIAL
Acid-Base Excess: 0.2 mmol/L (ref 0.0–2.0)
O2 Saturation: 95.9 %
Patient temperature: 98.6
TCO2: 25.2 mmol/L (ref 0–100)

## 2011-01-21 LAB — POCT I-STAT 3, ART BLOOD GAS (G3+)
O2 Saturation: 94 %
TCO2: 26 mmol/L (ref 0–100)
pCO2 arterial: 37.5 mmHg (ref 35.0–45.0)
pH, Arterial: 7.427 (ref 7.350–7.450)
pO2, Arterial: 71 mmHg — ABNORMAL LOW (ref 80.0–100.0)

## 2011-01-21 LAB — HEPARIN LEVEL (UNFRACTIONATED)
Heparin Unfractionated: 0.41 IU/mL (ref 0.30–0.70)
Heparin Unfractionated: 0.5 IU/mL (ref 0.30–0.70)
Heparin Unfractionated: 0.63 IU/mL (ref 0.30–0.70)

## 2011-01-21 LAB — TSH: TSH: 2.551 u[IU]/mL (ref 0.350–4.500)

## 2011-01-21 LAB — HEMOGLOBIN A1C
Hgb A1c MFr Bld: 5.2 % (ref 4.6–6.1)
Mean Plasma Glucose: 103 mg/dL

## 2011-01-21 LAB — ABO/RH: ABO/RH(D): O POS

## 2011-02-25 NOTE — Discharge Summary (Signed)
NAMECHANCE, MUNTER              ACCOUNT NO.:  [id number]   MEDICAL RECORD NO.:  [id number]          PATIENT TYPE:  INP   LOCATION:  2025                         FACILITY:  [facility]   PHYSICIAN:  Evelene Croon, M.D.     DATE OF BIRTH:  1927-10-14   DATE OF ADMISSION:  03/07/2009  DATE OF DISCHARGE:  03/20/2009                               DISCHARGE SUMMARY   HISTORY:  The patient is a 77 year old male with no prior cardiac  history who was evaluated by Dr. Jens Som.  Over the last 9 months, the  patient described a burning pain in his chest with exertion that was  relieved with rest.  This pain did not radiate.  It was not noted to be  pleuritic or positional nor related to food.  It did not occur at rest.  There was no associated nausea, shortness of breath or diaphoresis.  Notably it is increasing in frequency and he had pain on the morning of  evaluation by Dr. Jens Som on Mar 07, 2009.  This was noted while he was  making his bed.  The patient had an echocardiogram done on Mar 02, 2009  which suggested normal left ventricular function.  There was felt to be  moderate aortic stenosis with a valve area of 1.2 cm2 and a mean  gradient of 20 mmHg.  A Myoview was performed on Mar 02, 2009 which  suggested a moderate-sized reversible inferior apical defect.  He was  evaluated by Dr. Jens Som who felt he had symptoms consistent with  classic unstable angina and plans were made for admission for cardiac  catheterization.  He was admitted this hospitalization for that  procedure and further management pending these results.   MEDICATIONS PRIOR TO ADMISSION:  1. Metoprolol 25 mg daily.  2. Isosorbide dinitrate 10 mg one half tablet twice daily.  3. Potassium chloride 10 mEq 2 tablets daily.  4. Lisinopril/hydrochlorothiazide  20/12.5 mg once daily.  5. Amitriptyline 25 mg at bedtime.  6. Aspirin 81 mg daily.  7. Centrum Silver one daily.   PAST MEDICAL HISTORY:  Hypertension.   PAST  SURGICAL HISTORY:  Hernia surgery and cholecystectomy.   FAMILY HISTORY SOCIAL HISTORY REVIEW OF SYMPTOMS AND PHYSICAL  EXAMINATION:  Please see the history and physical done at the time of  admission.   HOSPITAL COURSE:  The patient was admitted and on Mar 07, 2009 he was  taken to the Cardiac Cath Lab by Dr. Clifton James where he was found to have  severe multivessel coronary artery disease.  The patient was also found  to have a left ventricular ejection fraction of 65% with no wall  abnormalities and normal systolic function.  The peak to peak aortic  valve gradient was 30-35 mmHg consistent with moderate aortic stenosis.  PA pressures were normal as was wedge pressure.  Cardiac index was 2.77.  Due to these findings a surgical consultation was obtained with Evelene Croon, MD who evaluated the patient and studies and agreed with  recommendations to proceed with surgical revascularization.  For full  details of the cardiac anatomy, please see  the cardiac catheterization  report.  The patient was felt to be stable medically and surgery was  scheduled.  On March 14, 2009 the patient was taken to the operating room  and underwent the following procedure; coronary artery bypass grafting  x3; the following grafts were placed.  1. Left internal mammary artery to the LAD.  2. Saphenous vein graft to the right coronary artery.  3. Saphenous vein graft to the diagonal.  Additionally, the patient      had an aortic valve replacement with a 23-mm pericardial tissue      valve.  The patient tolerated the procedure well was taken to the      surgical intensive care unit in stable condition.   POSTOPERATIVE HOSPITAL COURSE:  The patient has progressed nicely.  He  was weaned from the ventilator without difficulty.  He has remained  hemodynamically stable.  Inotropic support was weaned without  difficulty.  He did have an acute blood loss anemia.  He did require  transfusion for this.  His values have  stabilized.  His most recent  hemoglobin and hematocrit dated March 17, 2009 were 9.9 and 28.9  respectively.  Electrolytes, BUN and creatinine are within normal  limits.  The patient has maintained a normal sinus rhythm without  significant ectopy or dysrhythmias.  The patient's incisions are healing  well without evidence of infection.  He has tolerated gradual increase  in activities using standard protocols.  He has required a moderate  diuresis but has responded well to oral diuretics.  He did have a  postoperative thrombocytopenia but this has shown steady improvement.  He has not felt to be a candidate for Coumadin.  His overall status is  felt to be stable for discharge on March 20, 2009.   INSTRUCTIONS:  The patient received written instructions in regard to  medications, activity, diet, wound care and followup.   FOLLOW UP:  Dr. Jens Som 2 weeks postdischarge, Dr. Sharee Pimple PA on May 09, 2009 at 1:30 with a chest x-ray at that time.   MEDICATIONS ON DISCHARGE:  1. Aspirin 325 mg daily.  2. Zocor 40 mg daily.  3. Lopressor 25 mg twice daily.  4. Lisinopril 5 mg daily.  5. Lasix 40 mg daily for 7 days.  6. Potassium chloride 20 mEq daily for 7 days.  7. Lisinopril 5 mg daily.  8. Ultram 50 mg one every 6 hours as needed for pain.   FINAL DIAGNOSIS:  Severe three-vessel coronary artery disease and  moderate aortic stenosis now status post surgical procedure as described  above.   OTHER DIAGNOSES:  1. Postoperative anemia.  2. Postoperative thrombocytopenia.  3. Postoperative volume overload.  4. History of hypertension.  5. History of cholecystectomy.  6. History of hernia surgery x3.  7. History of back surgery.  8. History of remote tobacco use having quit in his 57s.      Rowe Clack, P.A.-C.      Evelene Croon, M.D.  Electronically Signed    WEG/MEDQ  D:  03/20/2009  T:  03/21/2009  Job:  [id number]   cc:   Verne Carrow, MD  Madolyn Frieze. Jens Som, MD,  [facility]

## 2011-02-25 NOTE — Cardiovascular Report (Signed)
NAMECARMINO, OCAIN              ACCOUNT NO.:  [id number]   MEDICAL RECORD NO.:  [id number]          PATIENT TYPE:  INP   LOCATION:  2919                         FACILITY:  [facility]   PHYSICIAN:  Verne Carrow, MDDATE OF BIRTH:  Jul 14, 1928   DATE OF PROCEDURE:  DATE OF DISCHARGE:                            CARDIAC CATHETERIZATION   PRIMARY CARDIOLOGIST:  Madolyn Frieze. Jens Som, MD, [facility]   PROCEDURE PERFORMED:  1. Left heart catheterization.  2. Selective coronary angiography.  3. Left ventricular angiogram.  4. Right heart catheterization.   OPERATOR:  Verne Carrow, MD   INDICATIONS:  Chest pain in a 77 year old Caucasian male with no prior  known coronary artery disease with otherwise having a history of  hypertension, but no diabetes mellitus or hyperlipidemia.  The patient  described burning-type chest pain with exertion over the last 9 months.  It has been increasing in frequency.  The patient underwent an  echocardiogram on May 21, at [place], [place] that showed normal  left ventricular systolic function.  However, he was felt to have  moderate aortic stenosis with a mean gradient 20 mmHg.  A Myoview stress  test was also performed this week that suggested a moderate-sized  reversible inferior apical defect.  The patient was referred today for a  diagnostic right and left heart catheterization.   DETAILS OF PROCEDURE:  The patient was brought into the Inpatient  Cardiac Catheterization Laboratory after signing informed consent,  further procedure.  The right groin was prepped and draped in a sterile  fashion.  A 1% Lidocaine was used for local anesthesia.  A 5-French  sheath was inserted into the right femoral artery without difficulty.  A  6-French sheath was inserted into the right femoral vein without  difficulty.  Standard diagnostic catheters were used to perform  selective coronary angiography.  A 5-French pigtail catheter was used  across the aortic  valve.  There was considerable difficulty crossing the  aortic valve.  I ultimately used the multipurpose catheter with straight  tipped stiff wire.  There was a gradient of 30 mmHg to 35 mmHg across  the aortic valve.  The patient tolerated the procedure well and was  taken to the holding area in stable condition.   HEMODYNAMIC FINDINGS:  Central aortic pressure 98/48.  Left ventricular  pressure 120/1.  Left ventricular end-diastolic pressure 10.  Right  atrial pressure 3.  Right ventricular pressure 26/1.  Right ventricular  end-diastolic pressure 4.  Pulmonary artery pressure 21/4, mean of 11.  Pulmonary capillary wedge pressure mean of 5.  Aortic saturation 94%.  Pulmonary aortic saturation 69%.  Cardiac output 5.3 L/minute, cardiac  index 2.77 L/minute per msq.   ANGIOGRAPHIC FINDINGS:  1. The left main coronary artery has a short segment has no      significant obstructive disease.  2. The left anterior descending is a large vessel that courses to the      apex and gives out a moderate-sized diagonal branch in the mid      portion.  There is an ostial 95% stenosis in the left anterior  descending coronary artery.  The mid portion of this vessel has a      long segment of diffuse disease of approximately 50% stenosis.      First diagonal is moderate-sized vessel with diffuse plaque.  3. Circumflex artery has an ostial 40%-50% stenosis.  The first obtuse      marginal is very small in caliber.  The second obtuse marginal has      diffuse plaque, but no severely obstructive lesions.  The mid      circumflex has a 60%-70% stenosis after the second marginal branch.      Additional circumflex is diffusely diseased without any flow      limiting areas.  4. The right coronary artery has a small to moderate-sized vessel that      gives of a small posterior descending artery and small      posterolateral branch.  There is a filling defect in the proximal      portion of the right  coronary artery that appears to be a thrombus      with 80% luminal obstruction.  There is a serial 90% lesions noted      in the mid to distal right coronary artery.  The vessel becomes      small and distally.  But the distal right coronary artery,      posterior descending artery, and posterolateral branches were small      in caliber.  5. Left ventricular angiogram was performed in RAO projection.  It      shows normal left ventricular systolic function with overall      ejection fraction of 65%.  There are no wall motion abnormalities      noted.  The peak to peak aortic valve gradient is 30 mmHg to 35      mmHg.  No significant mitral regurgitation was noted.   IMPRESSION:  1. Triple vessel coronary artery disease.  2. Normal left ventricular systolic function.  3. At least moderate aortic stenosis, possibly moderate to severe.  4. Normal right-sided pressures.   RECOMMENDATIONS:  This patient will need revascularization with either  surgical bypass or percutaneous coronary intervention.  Given the  severity of his coronary artery disease especially including the osteal,  left anterior descending lesion as well as the proximal right coronary  artery lesion and then circumflex lesion as well as the moderate to  severe aortic stenosis with possible need for aortic valve replacement,  I feel that the surgical consultation is necessary.  I will review the  films with my surgical colleagues and plan the best strategy for this  patient.  If we were felt to not to be a good surgical candidate, then  we could proceed with balloon angioplasty and stent placement in the  osteal left anterior descending, mid circumflex, and proximal right  coronary artery.  Because of the appearance of haziness and possible  thrombus in the proximal right coronary artery, I will resume the  patient's heparin drip 6 hours after his sheath removal tonight.  He  will be continued on full strength aspirin,  lisinopril,  hydrochlorothiazide, and beta-blocker.  We will also continue his statin  agent at the current time.  We will not start Plavix as he is a possible  surgical candidate.      Verne Carrow, MD  Electronically Signed     CM/MEDQ  D:  03/07/2009  T:  03/08/2009  Job:  [id number]   cc:   Arlys John  S. Stanford Breed, MD, [facility]

## 2011-02-25 NOTE — Assessment & Plan Note (Signed)
OFFICE VISIT   ENIS, RIECKE  DOB:  January 23, 1928                                        April 24, 2009  CHART #:  [id number]   The patient returned today for followup of a left pleural effusion,  status post coronary artery bypass surgery on March 14, 2009.  I last saw  him on April 09, 2009, at which time he had a moderate-sized left pleural  effusion that was worse from his previous x-ray.  He was doing well  clinically and I elected to start him on Lasix 40 mg per day x2 weeks.  He said he has continued to feel better.  He has had no shortness of  breath or chest pain.  He has had no cough or sputum production.  He  just finished his Lasix yesterday.  He is starting cardiac rehab  tomorrow.   PHYSICAL EXAMINATION:  VITAL SIGNS:  Blood pressure 130/67, pulse is 74  and regular, respiratory rate is 18 and unlabored, and oxygen saturation  is 97% on room air.  GENERAL:  He looks well.  CARDIAC:  Regular rate and rhythm with normal heart sounds.  LUNGS:  Increased breath sounds in the left lower lobe.  They are still  decreased at the left base compared to the right.  CHEST:  Chest incision is healing well and the sternum is stable.  EXTREMITIES:  There is no peripheral edema.   A followup chest x-ray today shows marked improvement in the left  pleural effusion.  There is now a small residual left pleural effusion  with left basilar atelectasis.   IMPRESSION:  The patient appears to be making good progress.  His left  pleural effusion is markedly improved with diuresis and is almost  resolved.  I do not think there is any further need for treatment at  this time.  I decided not to place him on long-term diuretics since he  has no  peripheral edema.  He will start cardiac rehab tomorrow and will follow  up with Dr. Olga Millers in September.  He will contact my office if  he develops any shortness of breath.   Evelene Croon, M.D.  Electronically  Signed   BB/MEDQ  D:  04/24/2009  T:  04/25/2009  Job:  [id number]

## 2011-02-25 NOTE — Op Note (Signed)
Terry Barrett, Terry Barrett              ACCOUNT NO.:  [id number]   MEDICAL RECORD NO.:  [id number]          PATIENT TYPE:  INP   LOCATION:  2313                         FACILITY:  [facility]   PHYSICIAN:  Terry Barrett, M.D.DATE OF BIRTH:  1928/06/14   DATE OF PROCEDURE:  03/14/2009  DATE OF DISCHARGE:                               OPERATIVE REPORT   INDICATIONS FOR PROCEDURE:  Terry Barrett is a 77 year old gentleman who  presents today for coronary artery bypass grafting and probably aortic  valve replacement to be performed by Dr. Evelene Barrett.  He was brought  to the holding area and underwent a routine sedation with local  anesthesia.  Pulmonary artery main arterial catheters were inserted.  He  was then taken to the OR for routine induction under general anesthesia.  After induction, the TEE probe is lubricated, protected, and passed  oropharyngeally into the stomach and slightly withdrawn for imaging of  the cardiac structures.   PRECARDIOPULMONARY BYPASS EXAMINATION:  Initially, attempts were made to  see the left ventricular chamber in both long and short axes views, but  multiple images were difficult to obtain due to the transitional  placement of the heart in the patient's chest again.  Four chamber views  and the transgastric views were difficult to obtain, essentially ruling  it out, being able to determine pressure gradient to an aortic valve  area with any certainty.  However, when short axis view with initial  attempt showed moderate left ventricular chamber hypertrophy, which is  concentric, there is good excellent overall contractile pattern noted,  appreciated in all segmental walls, ejection fraction is estimated to be  greater than 50%.  Long axis view again revealed good apical inferior  and anterior wall motion and thickening.  No masses were appreciated  within.   Mitral valve:  The mitral valve can only be seen in eccentric views, it  appeared to be somewhat  thickened, but they did appear to be functioning  entirely in appropriate manner with no significant mitral regurgitant  flow appreciated.   Left atrium with normal structure.   Aortic valve:  The aortic valve could not be seen or analyzed in deep  transgastric view.  However, the short axis view revealed that there  were 3 cusps, heavy calcium noted along the right and noncoronary cusp  with a nearly fixed noncoronary segment and a cusp.  There did appear to  be some obstruction to flow during systolic outflow in a long axis view,  however, from visualization in the valve and the short axis and long  axis views, it did not appear to be anything but moderate aortic  stenosis appreciated.  There was in fact turbulent jet appreciated above  the valve during systolic contraction as well as a central small aortic  insufficiency.   Right ventricle, tricuspid valve, and right atrium were essentially  normal.   The patient was placed on cardiopulmonary bypass, aortotomy performed.  The diseased aortic valve is excised to place by a #23 pericardial  tissue valve.  Coronary artery bypass grafting is carried out.  The  patient  was rewarmed and separated from cardiopulmonary bypass at the  initial attempt.   POSTCARDIOPULMONARY EXAMINATION:  Left Ventricle:  The left ventricular  chamber is again seen in the short axis view.  There remains good  excellent contractile pattern noted in both short and long axis views.   Aortic valve views were somewhat difficult to obtain in the post bypass.  However, the valve appeared to be seated well then leaflets of this  pericardial tissue valve were visualized, appeared to be opening  satisfactorily during systolic contraction and outflow and closing  appropriately during diastole such that there was no aortic regurgitant  flow appreciated.   No paravalvular jets were appreciated.  The rest of the cardiac exam was  as previously described, and the  patient was returned to the cardiac  intensive care unit in stable condition.           ______________________________  Terry Barrett, M.D.     JTM/MEDQ  D:  03/14/2009  T:  03/15/2009  Job:  [id number]

## 2011-02-25 NOTE — Op Note (Signed)
Terry Barrett, Terry Barrett              ACCOUNT NO.:  [id number]   MEDICAL RECORD NO.:  [id number]          PATIENT TYPE:  INP   LOCATION:  2313                         FACILITY:  [facility]   PHYSICIAN:  Evelene Croon, M.D.     DATE OF BIRTH:  [birth year]   DATE OF PROCEDURE:  03/14/2009  DATE OF DISCHARGE:                               OPERATIVE REPORT   PREOPERATIVE DIAGNOSIS:  Three-vessel coronary disease and moderate  aortic stenosis.   POSTOPERATIVE DIAGNOSIS:  Three-vessel coronary disease and moderate  aortic stenosis.   OPERATIVE PROCEDURE:  Median sternotomy, extracorporeal circulation,  coronary artery bypass graft surgery x3 using a left internal mammary  artery graft to the left anterior descending coronary artery, with a  saphenous vein graft to diagonal branch of LAD, and a saphenous vein  graft to the right coronary artery.  Aortic valve replacement using a 23-  mm Edwards pericardial valve, and the vein harvest from the right leg.   ATTENDING SURGEON:  Evelene Croon, MD   ASSISTANT:  Stephanie Acre. Dasovich, PA-C   ANESTHESIA:  General and endotracheal.   CLINICAL HISTORY:  This patient is a 77 year old gentleman with history  of hypertension who presented with a 6 to 56-month history of burning  pain in his chest with exertion.  This is occur with progressive  breathlessness, exertion.  He had a positive Myoview exam showing  moderate-sized inferior ischemia.  An echocardiogram showed moderate  aortic stenosis with an aortic valve area of 1.2 centimeter squared.  Cardiac catheterization showed 95% ostial LAD stenosis.  There was a  moderate-sized second marginal branch that was diffusely diseased  proximally.  The LAD itself had about 50% stenosis after the diagonal  branch.  The right coronary artery had a proximal filling defect with  about 80% obstruction and serial 90% distal lesions.  The left  circumflex had no critical stenosis with about 50% ostial narrowing at  the  most.  Ejection fraction was 65% with moderate aortic stenosis.  Echocardiogram here showed an aortic valve area of 1.55 to 1.7  centimeter squared with a peak gradient of 33 and a mean gradient of 18.  After review of the studies and examination of the patient, I felt that  aortic valve replacement and coronary artery bypass surgery is the best  treatment to prevent further ischemia and infarction.  I discussed the  operative procedure with the patient and his family.  We discussed  alternatives for valve replacement prosthesis and felt that a tissue  valve would be best at his age.  I discussed the benefits and risks of  surgery including but not limited to bleeding, blood transfusion,  infection, stroke, myocardial infarction, graft failure, structural  valve deterioration requiring replacement, heart block requiring  permanent pacemaker, and death.  He understood and agreed to proceed.   OPERATIVE PROCEDURE:  The patient was taken to the operating room and  placed on the table in supine position.  After induction of general  endotracheal anesthesia, a Foley catheter was placed into the bladder  using sterile technique.  Then, the chest, abdomen, and both  lower  extremities were prepped and draped in usual sterile manner.  A  transesophageal echocardiogram was performed by Anesthesiology.  The  echo window was very difficult in this patient for some reason and  visualization of the aortic valve was suboptimal and was not possible to  measure pressure gradient or velocity across the valve.  Left  ventricular function appeared normal with mild concentric left  ventricular hypertrophy.  There was no mitral regurgitation.   Then, the chest was opened through a median sternotomy incision and the  pericardium opened in the midline.  Examination of the heart showed good  ventricular contractility.  The ascending aorta had no palpable plaques  in it and was relatively long.   Then, the left  internal mammary artery was harvested from the chest wall  as a pedicle graft.  This was a medium caliber vessel with excellent  blood flow through it.  At the same time, a segment of greater saphenous  vein was harvested from the right leg using endoscopic vein harvest  technique.  This vein was a medium size and good quality.   The patient was then heparinized and when an adequate activated clotting  time was achieved, the distal ascending aorta was cannulated using a 20-  [place] aortic cannula for arterial inflow.  Venous outflow was achieved  using a two-stage venous cannula for the right atrial appendage.  An  antegrade cardioplegia and vent cannula was inserted in the aortic root.  A left ventricular vent was placed through right superior pulmonary vein  and a retrograde cardioplegic cannula was placed through pursestring  suture in the right atrium.   The patient was placed on cardiopulmonary bypass and distal coronary  arteries were identified.  The LAD was diffusely diseased with calcific  plaque.  The only placed was soft enough to graft was distally.  The  diagonal branch was also heavily diseased in its proximal and  midportion, but was graftable distally where there was no significant  disease.  The left circumflex gave off moderate-sized second and third  marginal branches.  I did not graft to left circumflex because of the  degree of stenosis did not appear significant.  The right coronary  artery was a small nondominant vessel that was heavily diseased in its  proximal and midportion.  Distally just prior the bifurcation of the  posterior descending branch, this vessel was suitable for grafting.   Then, the aorta was crossclamped and 500 mL of cold blood antegrade  cardioplegia was administered in the aortic root with quick arrest of  the heart.  This was followed by 400 mL of cold blood retrograde  cardioplegia.  Additional doses of retrograde cardioplegia were given   about 20 to 30-minute intervals to maintain myocardial temperature  around 10 degrees centigrade.   Then, the first distal anastomosis was performed to the distal right  coronary artery.  The internal diameter of this vessel was about 2 mm.  The conduit used was a segment of greater saphenous vein and the  anastomosis formed in end-to-side manner using continuous 7-0 Prolene  suture.  Flow was noted through the graft and was excellent.   Second distal anastomosis was performed to the diagonal branch.  The  internal diameter of this vessel was about 1.6 mm distally.  The conduit  used was a second segment of greater saphenous vein and the anastomosis  formed in end-to-side manner using continuous 7-0 Prolene suture.  Flow  was noted through the  graft and was excellent.   The third distal anastomosis was performed to the distal LAD.  The  internal diameter here was about 1.6 mm.  The conduit used was a left  internal mammary graft and this was brought through an opening of the  left pericardium anterior to the phrenic nerve.  It was anastomosed to  the LAD in end-to-side manner using continuous 8-0 Prolene suture.  The  pedicle was sutured to the epicardium with 6-0 Prolene sutures.   Then, attention was turned to aortic valve replacement.  The aorta was  opened transversely about 1 cm above the sinotubular junction.  Examination of the native valve showed that there were three leaflets.  These leaflets were thickened and the noncoronary leaflet had  significant calcification and immobility.  There was moderate annular  calcification.  The right and left coronary ostia were identified and  were not obstructed.  Then, the native valve was excised.  The annulus  was decalcified with rongeurs.  Care was taken to remove all particulate  debris.  The left ventricle and aortic root were copiously irrigated  with iced saline solution.  Then, the aortic annulus was sized and a 23-  mm Edwards  pericardial Magna - Ease valve was chosen.  This had model  #[id number], ray serial number [id number].  Then, a series of pledgeted 2-0  horizontal mattress sutures were placed around the aortic annulus with  the pledgets in the subannular position.  The sutures were placed  through the sewing ring and valve lowered in place.  The sutures were  tied sequentially.  The valve seated nicely.  There was no obstruction  of the coronary ostia.  Then, the patient was rewarmed to 37 degrees  centigrade.  The aortotomy was closed in two-layer using continuous 4-0  Prolene suture with felt strips to reinforce the closure.  Then, the two  proximal vein graft anastomoses were performed to the aortic root in end-  to-side manner using continuous 6-0 Prolene suture.  Then, the clamp was  removed from mammary pedicle.  There was rapid warming of the  ventricular septum and return of spontaneous ventricular fibrillation.  After de-airing, maneuvers were performed.  The head was placed in  Trendelenburg position and the crossclamp removed with time of 113  minutes.  There was spontaneous return of sinus rhythm.  The proximal  distal anastomoses appeared hemostatic and allowed the grafts  satisfactory.  Graft markers were placed around the proximal  anastomoses.  Two temporary right ventricular and right atrial pacing  wires placed and brought out through the skin.   When the patient rewarmed to 37 degrees centigrade, he was weaned from  cardiopulmonary bypass on no inotropic agents.  Total bypass time was  137 minutes.  Cardiac function appeared excellent with a cardiac output  of 5 liters per minute.  Transesophageal echocardiogram was performed.  It was still difficult to see the aortic valve, but there was no  evidence of perivalvular leak or regurgitation.  Left ventricular  function appeared well preserved.  Right ventricular function appeared  well preserved.  There was no mitral regurgitation.  Then,  protamine was  given.  The retrograde cardioplegia and left ventricular vents were  removed.  The aortic and venous cannulas were removed without  difficulty.  Hemostasis was achieved.  Three chest tubes were placed  with the tube in the posterior pericardium, one in the left pleural  space and one in the anterior mediastinum.  The pericardium  was loosely  reapproximated over the heart.  The sternum was closed with #6 stainless  steel wires.  The fascia was closed with continuous #1 Vicryl suture.  Subcutaneous tissue was closed with continuous 2-0 Vicryl and the skin  with 3-0 Vicryl subcuticular closure.  The lower extremity vein harvest  site was closed in layers in a similar manner.  The sponge, needles, and  instrument counts were correct according to the scrub nurse.  Dry  sterile dressings were applied over the incisions around the chest tubes  with short Pleur-Evac suction.  The patient remained hemodynamically  stable, and was transported to the SICU in guarded, but stable  condition.      Evelene Croon, M.D.  Electronically Signed     BB/MEDQ  D:  03/14/2009  T:  03/15/2009  Job:  [id number]   cc:   Madolyn Frieze. Jens Som, MD, [facility]

## 2011-02-25 NOTE — Consult Note (Signed)
Terry Barrett, Terry Barrett              ACCOUNT NO.:  [id number]   MEDICAL RECORD NO.:  [id number]          PATIENT TYPE:  INP   LOCATION:  2919                         FACILITY:  [facility]   PHYSICIAN:  Terry Barrett, M.D.     DATE OF BIRTH:  [birth year]   DATE OF CONSULTATION:  03/08/2009  DATE OF DISCHARGE:                                 CONSULTATION   REFERRING PHYSICIAN:  Madolyn Barrett. Terry Som, MD, [facility]   REASON FOR CONSULTATION:  Severe three-vessel coronary artery disease  and moderate aortic stenosis.   CLINICAL HISTORY:  I was asked by Dr. Jens Barrett to evaluate Terry Barrett  for consideration of aortic valve replacement and coronary bypass  surgery.  He is a 76 year old fairly active and overall healthy white  male who presents with a 6 to 55-month history of burning substernal  chest discomfort occurring with exertion and relieved with rest.  His  symptoms have been very reproducible and have occurred with  progressively less exertion over time.  He was admitted on the morning  of Mar 07, 2006, after developing this same pain while making his bed.  He had a Myoview stress test performed on Mar 02, 2009, that showed  moderate size reversible inferior apical defect.  An echocardiogram had  been performed because of heart murmur and this showed moderate aortic  stenosis with a valve area of 1.2 cm2 and mean gradient of 20 mmHg.  He  underwent cardiac catheterization yesterday by Dr. Clifton Barrett and this  showed three-vessel coronary disease.  The LAD had an ostial 95%  stenosis with a 50% long segment stenosis just at the diagonal branch.  This diagonal branch was moderate size vessel with diffuse proximal  disease.  The left circumflex had ostial 40% narrowing.  There was a  very small first marginal.  The second marginal was a moderate-sized  vessel with diffuse plaque.  The mid left circumflex had about 60%  stenosis after a second marginal branch.  The distal left circumflex was  small and  diffusely diseased.  The right coronary artery had a proximal  filling defect with about 80% luminal obstruction.  There were serial  90% lesions in the distal RCA.  The distal vessel was fairly small.  Left ventricular ejection fraction was 65% with no wall motion  abnormalities and normal systolic function.  The peak-to-peak aortic  valve gradient 30-35 mmHg consistent with moderate aortic stenosis.  PA  pressure was normal at 21/4 with a mean of 11.  Wedge pressure was mean  of 5.  Cardiac index was 2.77.  He has remained stable on heparin drip  without any further symptoms.   REVIEW OF SYSTEMS:  As follows:  GENERAL:  He denies any fever or  chills.  He has had only some weight changes.  He denies any fatigue.  EYES:  Negative.  ENT:  Negative.  ENDOCRINE:  He denies diabetes and  hypothyroidism.  CARDIOVASCULAR:  As above.  He has had some exertional  dyspnea associated with chest discomfort.  He denies any PND or  orthopnea.  He has had some ankle  edema when he is up on his feet all  day.  Denies palpitations.  RESPIRATORY:  Denies cough or sputum  production.  GI:  He denies nausea, vomiting.  He has had no melena or  bright red blood per rectum.  GU:  Denies dysuria and hematuria.  MUSCULOSKELETAL:  He does have history of prior back surgery with  degenerative arthritis.  NEUROLOGICAL:  Denies any focal weakness or  numbness.  Denies dizziness or syncope.  He has never had TIA or stroke.  HEMATOLOGICAL:  Denies any history of bleeding disorders or easy  bleeding.  VASCULAR:  Denies any history of claudication or phlebitis.  He has never had DVT.   ALLERGIES:  None.   PAST MEDICAL HISTORY:  Significant for hypertension.  He is status post  cholecystectomy and status post 3 hernia surgeries.  He is status post  back surgery in the past.   SOCIAL HISTORY:  He is married and lives with his wife.  He is retired.  He is a previous smoker, but quit in his 60s.  Denies alcohol  use.   FAMILY HISTORY:  His brother had heart attack at age 21.  Mother died of  congestive heart failure at age 50.  His father died of a stroke.  He  has a brother who has Hodgkin disease.   MEDICATIONS PRIOR TO ADMISSION:  1. Potassium chloride 10 mEq once daily for leg cramps.  2. Lisinopril/hydrochlorothiazide 25/12.5 mg daily.  3. Amitriptyline 25 mg daily.  4. Aspirin 325 mg daily.  5. Multivitamin daily.  6. Lopressor 25 mg b.i.d.  7. Isosorbide dinitrate 10 mg one-half tablet b.i.d.   PHYSICAL EXAMINATION:  VITAL SIGNS:  Blood pressure is 110/65 and pulse  of 53 and regular.  Respiratory rate is 16 and unlabored.  GENERAL:  He is a well-developed elderly white male in no distress.  HEENT:  Normocephalic and atraumatic.  Pupils are equal and reactive to  light and accommodation.  Extraocular muscles are intact.  His throat is  clear.  NECK:  Normal carotid pulses bilaterally.  There is a transmitted murmur  at both sides of his neck.  There is no JVD or adenopathy.  There is no  thyromegaly.  CARDIAC:  Regular rate and rhythm with a grade 3/6 systolic murmur over  his aorta.  LUNGS:  Clear.  ABDOMEN:  Active bowel sounds.  His abdomen is soft, flat, and  nontender.  There are no palpable masses or organomegaly.  EXTREMITIES:  No peripheral edema.  Pedal pulses are palpable  bilaterally.  SKIN:  Warm and dry.  NEUROLOGIC:  Alert and oriented x3.  Motor and sensory exams grossly  normal.   Electrocardiogram shows sinus bradycardia with no other acute changes.  Laboratory examination on admission showed a hemoglobin 14.1, platelets  155,000, BUN of 16, creatinine of 1.28.   IMPRESSION:  Terry Barrett has three-vessel coronary disease with high-  grade ostial left anterior descending and proximal right coronary  stenoses with a filling defect in the proximal right coronary artery.  He has moderate aortic stenosis by catheterization and echocardiogram.  He is in overall  good condition and has remained active.  I agree that  coronary artery bypass graft surgery and aortic valve replacement is the  best treatment to prevent further ischemia and infarction and improve  his quality of life.  I will plan to use a tissue valve.  I discussed  the operative procedure with the patient and his wife  including  alternatives, benefits, and risks including, but not limited to  bleeding, blood transfusion, infection, stroke, myocardial infarction,  graft failure, heart block requiring permanent pacemaker, and death.  They understand and like to proceed with surgery.  Our operative  schedule is full tomorrow and then there is a 2-day holiday weekend.  I  will plan to do his surgery on Tuesday next week.  If he has any  problems over the weekend, Dr. Cornelius Moras is on call and will see the patient.  He should remain on heparin until surgery.  We will obtain preoperative  carotid Dopplers.     Terry Barrett, M.D.  Electronically Signed    BB/MEDQ  D:  03/08/2009  T:  03/09/2009  Job:  [id number]

## 2011-02-25 NOTE — Assessment & Plan Note (Signed)
OFFICE VISIT   Terry, Barrett  DOB:  [birth year]                                        April 09, 2009  CHART #:  [id number]   The patient comes in today for a 3-week followup.  He is status post  aortic valve replacement and coronary artery bypass grafting on March 14, 2009.  His postoperative course was uneventful and he was discharged  home in good condition.  Since his discharge to home, the patient has  been doing fairly well.  His only complaint really at this point is  having trouble with cough when he lies flat.  He is otherwise not coughing and denies any shortness of breath or chest  discomfort.  He had been on a week's worth of Lasix at the time of  discharge but has completed this.  He has not noted any increase in his  weight, but does notice finally at the end of the day, his feet are  swollen.  He saw Dr. Ludwig Clarks PA last week and no medication changes  were made.  He does have a followup appointment to see them in a few  months.  He is eating well and is walking a fair amount around the  house.  He has been contacted by the cardiac rehab in [place] and he plans  to enroll as soon as he is cleared from our standpoint.   PHYSICAL EXAMINATION:  VITAL SIGNS:  Blood pressure is 108/56, pulse is  92, respirations 18, and O2 sat 96% on room air.  SKIN:  His sternal and leg incisions are well healed.  CHEST:  His sternum is stable to palpation.  HEART:  Regular rate and rhythm without murmurs, rubs, or gallops.  LUNGS:  Clear on the right with diminished breath sounds and a few  crackles on the left.  EXTREMITIES:  1+ edema on the right with mild edema on the left.   Chest x-ray today shows some moderate size left pleural effusion which  is worse from his previous x-ray.   ASSESSMENT AND PLAN:  The patient is doing well, status post aortic  valve replacement and coronary artery bypass graft.  He does have  increased left pleural effusion, but  does not appear to be very  symptomatic.  Since he responded well to the Lasix just after discharge,  I have prescribed another 2 weeks worth of Lasix 40 mg daily and  potassium 20 mEq daily.  We will plan on seeing him back at the end of  the 2 weeks with a repeat chest x-ray and hopefully we can avoid a  thoracentesis.  He is encouraged to proceed with cardiac rehab and is  released to begin driving.  We will see him back as directed in 2 weeks.   Evelene Croon, M.D.  Electronically Signed   GC/MEDQ  D:  04/09/2009  T:  04/10/2009  Job:  [id number]   cc:   Madolyn Frieze. Jens Som, MD, [facility]

## 2011-02-28 NOTE — Op Note (Signed)
NAME:  Terry Barrett, Terry Barrett                        ACCOUNT NO.:  [id number]   MEDICAL RECORD NO.:  [id number]                   PATIENT TYPE:  INP   LOCATION:  3007                                 FACILITY:  [facility]   PHYSICIAN:  Hewitt Shorts, M.D.            DATE OF BIRTH:  10-Nov-1927   DATE OF PROCEDURE:  DATE OF DISCHARGE:                                 OPERATIVE REPORT   PREOPERATIVE DIAGNOSIS:  Right L5-S1 extraforaminal lumbar disk herniation.   POSTOPERATIVE DIAGNOSIS:  Right L5-S1 extraforaminal lumbar disk herniation.   PROCEDURE PERFORMED:  Right L5-S1 extraforaminal microdiskectomy.   SURGEON:  Dr. Newell Coral.   ASSISTANT:  Dr. Venetia Maxon.   ANESTHESIA:  General endotracheal.   INDICATIONS FOR PROCEDURE:  Patient is a 77 year old man who presented with  a right lumbar radiculopathy, who was found by MRI scan to have a lumbar  disk herniation within the right L5-S1 foramen at extraforaminal space  directly compressing the right L5 nerve root.  The decision was made to  proceed with elective extraforaminal microdiskectomy.   PROCEDURE:  The patient was brought to the operating room and placed under  general endotracheal anesthesia.  The patient was returned to a prone  position and the lumbar region was prepped with Betadine soap and solution,  and draped in the sterile fashion.  The midline incision was infiltrated  with local anesthetic with epinephrine, an x-ray was taken to localize the  L5-S1 level and then a midline incision was made and carried down through  the subcutaneous tissue.  Bipolar cautery and electrocautery were used to  maintain hemostasis.  Dissection was carried down to the lumbar fascia which  was incised on the right side of the midline in the paraspinal muscles,  resection of the spinous processes lamina in subperiosteal fashion.  We  identified the right L5-S1 intralaminar space and x-ray was taken to confirm  the localization, and then dissection  was carried out over the right L5-S1  facet complex.  We exposed and identified the right L5 transverse process  and the ala of the right side of the sacrum.  The microscope was draped and  brought into the field to provide magnification to assist with  visualization.  Self retaining retractor was placed.  A microsurgical  microdissection technique was used to expose the right L5-S1 foramina and  extraforaminal space.  The drill was used to remove the superior aspect of  the right ala as well as perform a lateral right l5-S1 facetectomy.  Dissection was carried down into the extraforaminal space and the right L5  nerve root was identified.  The pedicle of L5 on the right was identified.  We were able to identify the right L5-S1 disk space.  The nerve root  appeared swollen and displaced, we were able to gradually examine the  surrounding foramen and extraforaminal space.  Disk herniation with a free  fragment was identified and this  was carefully removed, gently retracting  the nerve root.  This segment was removed in a piecemeal fashion and as  performed, the nerve root became decompressed, it was no longer bulging, it  was more freely mobile.  We did enter into the disk space and removed a  portion of the sub ligamentous disk herniation, spine-like overgrowth from  the inferior aspect of L5 was likewise removed.  In the end, all loose  fragments of disk material were removed from the foramen and extraforaminal  space, and good decompression of the right L5 nerve root was performed and  achieved.  Once the diskectomy was completed, hemostasis was established  through the use of Gelfoam soaked in thrombin, all the Gelfoam was removed  prior to closure.  Immediately prior to closure, and once hemostasis had  been established, we instilled 2 cc of Fentanyl and 80 mg of DepoMedrol into  the extraforaminal space and then we proceeded with closure.  The deep  fascia was closed with interrupted  undyed 1 Vicryl suture, the subcutaneous  and subcuticular area closed with interrupted 2-0 Vicryl sutures and the  skin was reapproximated with Dermabond.  The patient tolerated the procedure  well.  The estimated blood loss was 100 cc.  Sponge count was correct.   Following surgery, the patient was turned back to the supine position to be  reversed from anesthetic, extubated and transferred to the recovery room for  further care.                                               Hewitt Shorts, M.D.    RWN/MEDQ  D:  09/07/2002  T:  09/07/2002  Job:  [id number]

## 2011-04-02 ENCOUNTER — Encounter: Payer: Self-pay | Admitting: Cardiology

## 2011-04-03 ENCOUNTER — Encounter: Payer: Self-pay | Admitting: Cardiology

## 2011-04-03 ENCOUNTER — Ambulatory Visit (INDEPENDENT_AMBULATORY_CARE_PROVIDER_SITE_OTHER): Payer: Medicare Other | Admitting: Cardiology

## 2011-04-03 ENCOUNTER — Encounter: Payer: Self-pay | Admitting: *Deleted

## 2011-04-03 DIAGNOSIS — I359 Nonrheumatic aortic valve disorder, unspecified: Secondary | ICD-10-CM

## 2011-04-03 DIAGNOSIS — I10 Essential (primary) hypertension: Secondary | ICD-10-CM

## 2011-04-03 DIAGNOSIS — I2510 Atherosclerotic heart disease of native coronary artery without angina pectoris: Secondary | ICD-10-CM

## 2011-04-03 DIAGNOSIS — E7800 Pure hypercholesterolemia, unspecified: Secondary | ICD-10-CM

## 2011-04-03 LAB — LIPID PANEL
Cholesterol: 124 mg/dL (ref 0–200)
HDL: 48.7 mg/dL (ref >39.00)
LDL Cholesterol: 61 mg/dL (ref 0–99)
Triglycerides: 73 mg/dL (ref 0.0–149.0)
VLDL: 14.6 mg/dL (ref 0.0–40.0)

## 2011-04-03 LAB — BASIC METABOLIC PANEL
Chloride: 105 mEq/L (ref 96–112)
Potassium: 3.9 mEq/L (ref 3.5–5.1)
Sodium: 140 mEq/L (ref 135–145)

## 2011-04-03 LAB — HEPATIC FUNCTION PANEL
Albumin: 4.2 g/dL (ref 3.5–5.2)
Total Protein: 6.3 g/dL (ref 6.0–8.3)

## 2011-04-03 NOTE — Assessment & Plan Note (Signed)
Continue present medications. Check potassium and renal function. 

## 2011-04-03 NOTE — Assessment & Plan Note (Signed)
Continue statin. Check lipids and liver. 

## 2011-04-03 NOTE — Assessment & Plan Note (Signed)
Continue aspirin and statin. 

## 2011-04-03 NOTE — Assessment & Plan Note (Signed)
Continue SBE prophylaxis.followup echocardiograms in the future.

## 2011-04-03 NOTE — Progress Notes (Signed)
HPI: Pleasant gentleman for f/u CAD and AVR.  Patient underwent coronary artery bypass grafting x3 as well as aortic valve replacement with a pericardial tissue valve in May of 2010. Last echo in June of 2011 revealed normal LV function and prosthetic aortic valve with mean gradient of 14 mmHg. I last saw him in May of 2011. Since then the patient denies any dyspnea on exertion, orthopnea, PND, pedal edema, palpitations, syncope or chest pain.  Current Outpatient Prescriptions  Medication Sig Dispense Refill  . amitriptyline (ELAVIL) 25 MG tablet Take 25 mg by mouth daily.        Marland Kitchen aspirin 325 MG tablet Take 325 mg by mouth daily.        Marland Kitchen lisinopril (PRINIVIL,ZESTRIL) 5 MG tablet Take 5 mg by mouth daily.        . metoprolol tartrate (LOPRESSOR) 25 MG tablet Take 12.5 mg by mouth 2 (two) times daily.        . Multiple Vitamin (MULTIVITAMIN) tablet Take 1 tablet by mouth daily.        . nitroGLYCERIN (NITROSTAT) 0.4 MG SL tablet Place 0.4 mg under the tongue every 5 (five) minutes as needed. May repeat for up to 3 doses.       . Omega-3 Fatty Acids (FISH OIL) 1200 MG CAPS Take by mouth daily.        . simvastatin (ZOCOR) 40 MG tablet Take 40 mg by mouth daily.           Past Medical History  Diagnosis Date  . CAD (coronary artery disease)     5/10, myoview: mod inferapical ischemia; 03/07/09 cath 3vd, EF 65%, 30-35 mmHg peak to Ao Valve gradient;  . Essential hypertension, benign   . Hyperlipidemia   . Aortic stenosis     Past Surgical History  Procedure Date  . Hernia repair     x3  . Cholecystectomy   . Back surgery   . Coronary artery bypass graft 03/14/2009    x3, LIMA - LAD, SVG - Diag, SVG - RCA  . Cardiac catheterization 03/07/2009    3vd. EF 65%, 30-35 mmHm peak Ao Valve gradient  . Aortic valve replacement 03/14/2009    using a 23 mm Edwards pericardial valve    History   Social History  . Marital Status: Married    Spouse Name: N/A    Number of Children: N/A  .  Years of Education: N/A   Occupational History  . Retired    Social History Main Topics  . Smoking status: Former Games developer  . Smokeless tobacco: Not on file   Comment: Quit in his 20's  . Alcohol Use: No  . Drug Use: Not on file  . Sexually Active: Not on file   Other Topics Concern  . Not on file   Social History Narrative   Married and lives with wife    ROS: no fevers or chills, productive cough, hemoptysis, dysphasia, odynophagia, melena, hematochezia, dysuria, hematuria, rash, seizure activity, orthopnea, PND, pedal edema, claudication. Remaining systems are negative.  Physical Exam: Well-developed well-nourished in no acute distress.  Skin is warm and dry.  HEENT is normal.  Neck is supple. No thyromegaly.  Chest is clear to auscultation with normal expansion.  Cardiovascular exam is regular rate and rhythm. 2/6 systolic murmur left sternal border. Abdominal exam nontender or distended. No masses palpated. Extremities show no edema. neuro grossly intact  ECG Sinus bradycardia with no ST changes.

## 2011-04-03 NOTE — Patient Instructions (Signed)
Your physician wants you to follow-up in: ONE YEAR  You will receive a reminder letter in the mail two months in advance. If you don't receive a letter, please call our office to schedule the follow-up appointment.   Your physician recommends that you return for lab work in: TODAY  

## 2011-05-29 ENCOUNTER — Other Ambulatory Visit: Payer: Self-pay | Admitting: *Deleted

## 2011-05-29 MED ORDER — SIMVASTATIN 40 MG PO TABS: 40.0000 mg | ORAL_TABLET | Freq: Every day | ORAL | Status: DC

## 2012-04-02 ENCOUNTER — Ambulatory Visit (INDEPENDENT_AMBULATORY_CARE_PROVIDER_SITE_OTHER): Payer: Medicare Other | Admitting: Cardiology

## 2012-04-02 ENCOUNTER — Encounter: Payer: Self-pay | Admitting: Cardiology

## 2012-04-02 VITALS — BP 145/70 | HR 60 | Wt 149.0 lb

## 2012-04-02 DIAGNOSIS — Z952 Presence of prosthetic heart valve: Secondary | ICD-10-CM

## 2012-04-02 DIAGNOSIS — E785 Hyperlipidemia, unspecified: Secondary | ICD-10-CM

## 2012-04-02 DIAGNOSIS — I359 Nonrheumatic aortic valve disorder, unspecified: Secondary | ICD-10-CM

## 2012-04-02 DIAGNOSIS — I10 Essential (primary) hypertension: Secondary | ICD-10-CM

## 2012-04-02 DIAGNOSIS — I2510 Atherosclerotic heart disease of native coronary artery without angina pectoris: Secondary | ICD-10-CM

## 2012-04-02 DIAGNOSIS — R0989 Other specified symptoms and signs involving the circulatory and respiratory systems: Secondary | ICD-10-CM

## 2012-04-02 MED ORDER — NITROGLYCERIN 0.4 MG SL SUBL: 0.4000 mg | SUBLINGUAL_TABLET | SUBLINGUAL | Status: DC | PRN

## 2012-04-02 MED ORDER — LISINOPRIL 5 MG PO TABS: 5.0000 mg | ORAL_TABLET | Freq: Every day | ORAL | Status: DC

## 2012-04-02 MED ORDER — SIMVASTATIN 40 MG PO TABS: 40.0000 mg | ORAL_TABLET | Freq: Every day | ORAL | Status: DC

## 2012-04-02 MED ORDER — METOPROLOL TARTRATE 25 MG PO TABS: 12.5000 mg | ORAL_TABLET | Freq: Two times a day (BID) | ORAL | Status: DC

## 2012-04-02 NOTE — Assessment & Plan Note (Signed)
Plan repeat echocardiogram for aortic valve replacement. Continue SBE prophylaxis.

## 2012-04-02 NOTE — Assessment & Plan Note (Signed)
Continue statin. Lipids and liver monitored by primary care. 

## 2012-04-02 NOTE — Assessment & Plan Note (Signed)
Continue aspirin and statin. 

## 2012-04-02 NOTE — Assessment & Plan Note (Signed)
Blood pressure controlled. Continue present medications. Potassium and renal function monitored by primary care. 

## 2012-04-02 NOTE — Assessment & Plan Note (Signed)
Schedule abdominal ultrasound to exclude aneurysm. 

## 2012-04-02 NOTE — Progress Notes (Signed)
HPI: Pleasant gentleman with h/o exertional angina s/p abnl myoview leading to cath showing 3vd and Mod. Ao Stenosis.  He subsequently underwent coronary artery bypass grafting x3 as well as aortic valve replacement with a pericardial tissue valve in May of 2010. Last echocardiogram in June of 2011 showed normal LV function and a normally functioning prosthetic aortic valve with a mean gradient of 14 mm of mercury. I last saw him in May of 2011. Since then the patient denies any dyspnea on exertion, orthopnea, PND, pedal edema, palpitations, syncope or chest pain.  Current Outpatient Prescriptions  Medication Sig Dispense Refill  . amitriptyline (ELAVIL) 25 MG tablet Take 25 mg by mouth daily.        Marland Kitchen aspirin 325 MG tablet Take 325 mg by mouth daily.        Marland Kitchen lisinopril (PRINIVIL,ZESTRIL) 5 MG tablet Take 5 mg by mouth daily.        . metoprolol tartrate (LOPRESSOR) 25 MG tablet Take 12.5 mg by mouth 2 (two) times daily.        . Multiple Vitamin (MULTIVITAMIN) tablet Take 1 tablet by mouth daily.        . nitroGLYCERIN (NITROSTAT) 0.4 MG SL tablet Place 0.4 mg under the tongue every 5 (five) minutes as needed. May repeat for up to 3 doses.       . Omega-3 Fatty Acids (FISH OIL) 1200 MG CAPS Take by mouth daily.        . simvastatin (ZOCOR) 40 MG tablet Take 1 tablet (40 mg total) by mouth daily.  90 tablet  3     Past Medical History  Diagnosis Date  . CAD (coronary artery disease)     5/10, myoview: mod inferapical ischemia; 03/07/09 cath 3vd, EF 65%, 30-35 mmHg peak to Ao Valve gradient;  . Essential hypertension, benign   . Hyperlipidemia   . Aortic stenosis     Past Surgical History  Procedure Date  . Hernia repair     x3  . Cholecystectomy   . Back surgery   . Coronary artery bypass graft 03/14/2009    x3, LIMA - LAD, SVG - Diag, SVG - RCA  . Cardiac catheterization 03/07/2009    3vd. EF 65%, 30-35 mmHm peak Ao Valve gradient  . Aortic valve replacement 03/14/2009   using a 23 mm Edwards pericardial valve    History   Social History  . Marital Status: Married    Spouse Name: N/A    Number of Children: N/A  . Years of Education: N/A   Occupational History  . Retired    Social History Main Topics  . Smoking status: Former Games developer  . Smokeless tobacco: Not on file   Comment: Quit in his 20's  . Alcohol Use: No  . Drug Use: Not on file  . Sexually Active: Not on file   Other Topics Concern  . Not on file   Social History Narrative   Married and lives with wife    ROS: weight loss of approximately six pounds and right hip arthralgias but no fevers or chills, productive cough, hemoptysis, dysphasia, odynophagia, melena, hematochezia, dysuria, hematuria, rash, seizure activity, orthopnea, PND, pedal edema, claudication. Remaining systems are negative.  Physical Exam: Well-developed well-nourished in no acute distress.  Skin is warm and dry.  HEENT is normal.  Neck is supple.  Chest is clear to auscultation with normal expansion.  Cardiovascular exam is regular rate and rhythm. 2/6 systolic murmur left sternal border. Abdominal  exam nontender or distended. No masses palpated. Positive bruit. Extremities show no edema. neuro grossly intact  ECG sinus rhythm with occasional PACs. Axis normal. No ST changes.

## 2012-04-02 NOTE — Patient Instructions (Addendum)
Your physician wants you to follow-up in: ONE YEAR WITH DR CRENSHAW You will receive a reminder letter in the mail two months in advance. If you don't receive a letter, please call our office to schedule the follow-up appointment.   Your physician has requested that you have an echocardiogram. Echocardiography is a painless test that uses sound waves to create images of your heart. It provides your doctor with information about the size and shape of your heart and how well your heart's chambers and valves are working. This procedure takes approximately one hour. There are no restrictions for this procedure.   Your physician has requested that you have an abdominal aorta duplex. During this test, an ultrasound is used to evaluate the aorta. Allow 30 minutes for this exam. Do not eat after midnight the day before and avoid carbonated beverages  

## 2012-04-07 ENCOUNTER — Ambulatory Visit (HOSPITAL_COMMUNITY): Payer: Medicare Other | Attending: Cardiology

## 2012-04-07 DIAGNOSIS — Z952 Presence of prosthetic heart valve: Secondary | ICD-10-CM

## 2012-04-07 DIAGNOSIS — Z87891 Personal history of nicotine dependence: Secondary | ICD-10-CM | POA: Insufficient documentation

## 2012-04-07 DIAGNOSIS — I359 Nonrheumatic aortic valve disorder, unspecified: Secondary | ICD-10-CM | POA: Insufficient documentation

## 2012-04-07 DIAGNOSIS — E785 Hyperlipidemia, unspecified: Secondary | ICD-10-CM | POA: Insufficient documentation

## 2012-04-07 DIAGNOSIS — I10 Essential (primary) hypertension: Secondary | ICD-10-CM | POA: Insufficient documentation

## 2012-04-07 DIAGNOSIS — I2510 Atherosclerotic heart disease of native coronary artery without angina pectoris: Secondary | ICD-10-CM | POA: Insufficient documentation

## 2012-04-07 DIAGNOSIS — Z954 Presence of other heart-valve replacement: Secondary | ICD-10-CM | POA: Insufficient documentation

## 2012-04-07 NOTE — Progress Notes (Signed)
Echocardiogram performed.  

## 2012-04-23 ENCOUNTER — Encounter (INDEPENDENT_AMBULATORY_CARE_PROVIDER_SITE_OTHER): Payer: Medicare Other

## 2012-04-23 DIAGNOSIS — I700 Atherosclerosis of aorta: Secondary | ICD-10-CM

## 2012-04-23 DIAGNOSIS — R0989 Other specified symptoms and signs involving the circulatory and respiratory systems: Secondary | ICD-10-CM

## 2013-03-31 ENCOUNTER — Encounter: Payer: Self-pay | Admitting: Cardiology

## 2013-03-31 ENCOUNTER — Ambulatory Visit (INDEPENDENT_AMBULATORY_CARE_PROVIDER_SITE_OTHER): Payer: Medicare Other | Admitting: Cardiology

## 2013-03-31 VITALS — BP 120/62 | HR 51 | Ht 67.0 in | Wt 152.8 lb

## 2013-03-31 DIAGNOSIS — I10 Essential (primary) hypertension: Secondary | ICD-10-CM

## 2013-03-31 DIAGNOSIS — I2510 Atherosclerotic heart disease of native coronary artery without angina pectoris: Secondary | ICD-10-CM

## 2013-03-31 DIAGNOSIS — I359 Nonrheumatic aortic valve disorder, unspecified: Secondary | ICD-10-CM

## 2013-03-31 DIAGNOSIS — R0989 Other specified symptoms and signs involving the circulatory and respiratory systems: Secondary | ICD-10-CM

## 2013-03-31 DIAGNOSIS — E785 Hyperlipidemia, unspecified: Secondary | ICD-10-CM

## 2013-03-31 NOTE — Progress Notes (Signed)
HPI: Patient had coronary artery bypass grafting x3 as well as aortic valve replacement with a pericardial tissue valve in May of 2010. Last echocardiogram in June of 2013 showed normal LV function and a normally functioning prosthetic aortic valve with a mean gradient of 16 mm of mercury. Abdominal ultrasound in July of 2013 showed no aneurysm. I last saw him in June of 2013. Since then the patient denies any dyspnea on exertion, orthopnea, PND, pedal edema, palpitations, syncope or chest pain.   Current Outpatient Prescriptions  Medication Sig Dispense Refill  . amitriptyline (ELAVIL) 25 MG tablet Take 25 mg by mouth daily.        Marland Kitchen aspirin 325 MG tablet Take 325 mg by mouth daily.        Tery Sanfilippo Calcium (STOOL SOFTENER PO) Take by mouth as needed.      Marland Kitchen Fexofenadine HCl (ALLEGRA PO) Take by mouth as needed.      Marland Kitchen lisinopril-hydrochlorothiazide (PRINZIDE,ZESTORETIC) 10-12.5 MG per tablet Take 1 tablet by mouth daily.      . metoprolol tartrate (LOPRESSOR) 25 MG tablet Take 0.5 tablets (12.5 mg total) by mouth 2 (two) times daily.  180 tablet  4  . Multiple Vitamin (MULTIVITAMIN) tablet Take 1 tablet by mouth daily.        . nitroGLYCERIN (NITROSTAT) 0.4 MG SL tablet Place 1 tablet (0.4 mg total) under the tongue every 5 (five) minutes as needed. May repeat for up to 3 doses.  25 tablet  12  . Omega-3 Fatty Acids (FISH OIL) 1200 MG CAPS Take by mouth daily.        . simvastatin (ZOCOR) 40 MG tablet Take 1 tablet (40 mg total) by mouth daily.  90 tablet  4   No current facility-administered medications for this visit.     Past Medical History  Diagnosis Date  . CAD (coronary artery disease)     5/10, myoview: mod inferapical ischemia; 03/07/09 cath 3vd, EF 65%, 30-35 mmHg peak to Ao Valve gradient;  . Essential hypertension, benign   . Hyperlipidemia   . Aortic stenosis     Past Surgical History  Procedure Laterality Date  . Hernia repair      x3  . Cholecystectomy    . Back  surgery    . Coronary artery bypass graft  03/14/2009    x3, LIMA - LAD, SVG - Diag, SVG - RCA  . Cardiac catheterization  03/07/2009    3vd. EF 65%, 30-35 mmHm peak Ao Valve gradient  . Aortic valve replacement  03/14/2009    using a 23 mm Edwards pericardial valve    History   Social History  . Marital Status: Married    Spouse Name: N/A    Number of Children: N/A  . Years of Education: N/A   Occupational History  . Retired    Social History Main Topics  . Smoking status: Former Games developer  . Smokeless tobacco: Not on file     Comment: Quit in his 20's  . Alcohol Use: No  . Drug Use: Not on file  . Sexually Active: Not on file   Other Topics Concern  . Not on file   Social History Narrative   Married and lives with wife    ROS: no fevers or chills, productive cough, hemoptysis, dysphasia, odynophagia, melena, hematochezia, dysuria, hematuria, rash, seizure activity, orthopnea, PND, pedal edema, claudication. Remaining systems are negative.  Physical Exam: Well-developed well-nourished in no acute distress.  Skin is warm  and dry.  HEENT is normal.  Neck is supple. Left carotid bruit Chest is clear to auscultation with normal expansion.  Cardiovascular exam is regular rate and rhythm. 2/6 systolic murmur left sternal border. No diastolic murmur. Abdominal exam nontender or distended. No masses palpated. Positive bruit Extremities show no edema. neuro grossly intact  ECG sinus bradycardia at a rate of 51. No ST changes.

## 2013-03-31 NOTE — Assessment & Plan Note (Signed)
Continue statin. Lipids and liver monitored by primary care. 

## 2013-03-31 NOTE — Assessment & Plan Note (Signed)
Continue aspirin and statin. 

## 2013-03-31 NOTE — Assessment & Plan Note (Signed)
Repeat echocardiogram. Continue SBE prophylaxis. 

## 2013-03-31 NOTE — Patient Instructions (Signed)
Your physician wants you to follow-up in: ONE YEAR WITH DR Shelda Pal will receive a reminder letter in the mail two months in advance. If you don't receive a letter, please call our office to schedule the follow-up appointment.   Your physician has requested that you have a carotid duplex. This test is an ultrasound of the carotid arteries in your neck. It looks at blood flow through these arteries that supply the brain with blood. Allow one hour for this exam. There are no restrictions or special instructions.   Your physician has requested that you have an echocardiogram. Echocardiography is a painless test that uses sound waves to create images of your heart. It provides your doctor with information about the size and shape of your heart and how well your heart's chambers and valves are working. This procedure takes approximately one hour. There are no restrictions for this procedure.

## 2013-03-31 NOTE — Assessment & Plan Note (Signed)
Blood pressure controlled. Continue present medications. 

## 2013-03-31 NOTE — Assessment & Plan Note (Signed)
Check carotid Dopplers °

## 2013-04-18 ENCOUNTER — Encounter (INDEPENDENT_AMBULATORY_CARE_PROVIDER_SITE_OTHER): Payer: Medicare Other

## 2013-04-18 ENCOUNTER — Ambulatory Visit (HOSPITAL_COMMUNITY): Payer: Medicare Other | Attending: Cardiology | Admitting: Radiology

## 2013-04-18 DIAGNOSIS — E785 Hyperlipidemia, unspecified: Secondary | ICD-10-CM | POA: Insufficient documentation

## 2013-04-18 DIAGNOSIS — I079 Rheumatic tricuspid valve disease, unspecified: Secondary | ICD-10-CM | POA: Insufficient documentation

## 2013-04-18 DIAGNOSIS — I10 Essential (primary) hypertension: Secondary | ICD-10-CM | POA: Insufficient documentation

## 2013-04-18 DIAGNOSIS — I2510 Atherosclerotic heart disease of native coronary artery without angina pectoris: Secondary | ICD-10-CM

## 2013-04-18 DIAGNOSIS — R0989 Other specified symptoms and signs involving the circulatory and respiratory systems: Secondary | ICD-10-CM

## 2013-04-18 DIAGNOSIS — I359 Nonrheumatic aortic valve disorder, unspecified: Secondary | ICD-10-CM

## 2013-04-18 DIAGNOSIS — Z954 Presence of other heart-valve replacement: Secondary | ICD-10-CM | POA: Insufficient documentation

## 2013-04-18 DIAGNOSIS — I6529 Occlusion and stenosis of unspecified carotid artery: Secondary | ICD-10-CM

## 2013-04-18 NOTE — Progress Notes (Signed)
Echocardiogram performed.  

## 2013-06-21 ENCOUNTER — Other Ambulatory Visit: Payer: Self-pay | Admitting: *Deleted

## 2013-06-21 MED ORDER — SIMVASTATIN 40 MG PO TABS: 40.0000 mg | ORAL_TABLET | Freq: Every day | ORAL | Status: DC

## 2013-06-21 MED ORDER — METOPROLOL TARTRATE 25 MG PO TABS: 12.5000 mg | ORAL_TABLET | Freq: Two times a day (BID) | ORAL | Status: DC

## 2013-10-04 ENCOUNTER — Telehealth: Payer: Self-pay | Admitting: Cardiology

## 2013-10-04 DIAGNOSIS — I679 Cerebrovascular disease, unspecified: Secondary | ICD-10-CM

## 2013-10-04 NOTE — Telephone Encounter (Signed)
Spoke with pt wife, carotid scheduled.

## 2013-10-04 NOTE — Telephone Encounter (Signed)
New problem    From the last office visit was told to have a repeat echo . Wife calling stating she has not heard anything as to when the follow up will be.

## 2013-10-20 ENCOUNTER — Ambulatory Visit (HOSPITAL_COMMUNITY): Payer: Medicare Other | Attending: Cardiology

## 2013-10-20 ENCOUNTER — Encounter: Payer: Self-pay | Admitting: Cardiology

## 2013-10-20 ENCOUNTER — Encounter (INDEPENDENT_AMBULATORY_CARE_PROVIDER_SITE_OTHER): Payer: Self-pay

## 2013-10-20 DIAGNOSIS — I6529 Occlusion and stenosis of unspecified carotid artery: Secondary | ICD-10-CM | POA: Insufficient documentation

## 2013-10-20 DIAGNOSIS — E785 Hyperlipidemia, unspecified: Secondary | ICD-10-CM | POA: Insufficient documentation

## 2013-10-20 DIAGNOSIS — Z951 Presence of aortocoronary bypass graft: Secondary | ICD-10-CM | POA: Insufficient documentation

## 2013-10-20 DIAGNOSIS — R0989 Other specified symptoms and signs involving the circulatory and respiratory systems: Secondary | ICD-10-CM | POA: Insufficient documentation

## 2013-10-20 DIAGNOSIS — Z87891 Personal history of nicotine dependence: Secondary | ICD-10-CM | POA: Insufficient documentation

## 2013-10-20 DIAGNOSIS — I2510 Atherosclerotic heart disease of native coronary artery without angina pectoris: Secondary | ICD-10-CM | POA: Insufficient documentation

## 2013-10-20 DIAGNOSIS — I658 Occlusion and stenosis of other precerebral arteries: Secondary | ICD-10-CM | POA: Insufficient documentation

## 2013-10-20 DIAGNOSIS — I10 Essential (primary) hypertension: Secondary | ICD-10-CM | POA: Insufficient documentation

## 2013-10-20 DIAGNOSIS — I679 Cerebrovascular disease, unspecified: Secondary | ICD-10-CM

## 2013-10-21 ENCOUNTER — Telehealth: Payer: Self-pay | Admitting: Cardiology

## 2013-10-21 NOTE — Telephone Encounter (Signed)
Follow up     Returning Debra's call---will be home all afternoon.

## 2013-10-21 NOTE — Telephone Encounter (Signed)
New message ° ° ° °Returning nurses call to get test results °

## 2013-10-21 NOTE — Telephone Encounter (Signed)
Spoke with pt, aware of carotid doppler results. 

## 2014-03-16 ENCOUNTER — Ambulatory Visit (INDEPENDENT_AMBULATORY_CARE_PROVIDER_SITE_OTHER): Payer: Medicare Other | Admitting: Cardiology

## 2014-03-16 ENCOUNTER — Encounter: Payer: Self-pay | Admitting: Cardiology

## 2014-03-16 VITALS — BP 118/62 | HR 52 | Ht 67.0 in | Wt 152.1 lb

## 2014-03-16 DIAGNOSIS — I679 Cerebrovascular disease, unspecified: Secondary | ICD-10-CM | POA: Insufficient documentation

## 2014-03-16 DIAGNOSIS — I2510 Atherosclerotic heart disease of native coronary artery without angina pectoris: Secondary | ICD-10-CM

## 2014-03-16 DIAGNOSIS — I359 Nonrheumatic aortic valve disorder, unspecified: Secondary | ICD-10-CM

## 2014-03-16 DIAGNOSIS — I10 Essential (primary) hypertension: Secondary | ICD-10-CM

## 2014-03-16 NOTE — Assessment & Plan Note (Signed)
Continue SBE prophylaxis. 

## 2014-03-16 NOTE — Assessment & Plan Note (Signed)
Continue aspirin and statin. Followup carotid Dopplers July 2015. 

## 2014-03-16 NOTE — Patient Instructions (Signed)
Your physician wants you to follow-up in: ONE YEAR WITH DR Shirley Muscat will receive a reminder letter in the mail two months in advance. If you don't receive a letter, please call our office to schedule the follow-up appointment.   Your physician has requested that you have a carotid duplex. This test is an ultrasound of the carotid arteries in your neck. It looks at blood flow through these arteries that supply the brain with blood. Allow one hour for this exam. There are no restrictions or special instructions.DUE IN [place]

## 2014-03-16 NOTE — Assessment & Plan Note (Signed)
Continue aspirin and statin. 

## 2014-03-16 NOTE — Assessment & Plan Note (Signed)
Continue statin. Lipids and liver monitored by primary care. 

## 2014-03-16 NOTE — Progress Notes (Signed)
HPI: FU CAD/AVR; coronary artery bypass grafting x3 as well as aortic valve replacement with a pericardial tissue valve in May of 2010. Abdominal ultrasound in July of 2013 showed no aneurysm. Last echocardiogram in July 2014 showed normal LV function and a normally functioning prosthetic aortic valve with a mean gradient of 11 mm of mercury; mild RAE/RVE. Carotid dopplers 1/15 showed 60-79 bilateral stenosis and fu recommended in six months. I last saw him in June of 2014. Since then the patient denies any dyspnea on exertion, orthopnea, PND, pedal edema, palpitations, syncope or chest pain.   Current Outpatient Prescriptions  Medication Sig Dispense Refill  . aspirin 325 MG tablet Take 325 mg by mouth daily.        Tery Sanfilippo. Docusate Calcium (STOOL SOFTENER PO) Take by mouth as needed.      Marland Kitchen. Fexofenadine HCl (ALLEGRA PO) Take by mouth as needed.      Marland Kitchen. lisinopril-hydrochlorothiazide (PRINZIDE,ZESTORETIC) 10-12.5 MG per tablet Take 1 tablet by mouth daily.      . metoprolol tartrate (LOPRESSOR) 25 MG tablet Take 0.5 tablets (12.5 mg total) by mouth 2 (two) times daily.  180 tablet  3  . Multiple Vitamin (MULTIVITAMIN) tablet Take 1 tablet by mouth daily.        . nitroGLYCERIN (NITROSTAT) 0.4 MG SL tablet Place 1 tablet (0.4 mg total) under the tongue every 5 (five) minutes as needed. May repeat for up to 3 doses.  25 tablet  12  . Omega-3 Fatty Acids (FISH OIL) 1200 MG CAPS Take by mouth daily.        . simvastatin (ZOCOR) 40 MG tablet Take 1 tablet (40 mg total) by mouth daily.  90 tablet  3   No current facility-administered medications for this visit.     Past Medical History  Diagnosis Date  . CAD (coronary artery disease)     5/10, myoview: mod inferapical ischemia; 03/07/09 cath 3vd, EF 65%, 30-35 mmHg peak to Ao Valve gradient;  . Essential hypertension, benign   . Hyperlipidemia   . Aortic stenosis     Past Surgical History  Procedure Laterality Date  . Hernia repair     x3  . Cholecystectomy    . Back surgery    . Coronary artery bypass graft  03/14/2009    x3, LIMA - LAD, SVG - Diag, SVG - RCA  . Cardiac catheterization  03/07/2009    3vd. EF 65%, 30-35 mmHm peak Ao Valve gradient  . Aortic valve replacement  03/14/2009    using a 23 mm Edwards pericardial valve    History   Social History  . Marital Status: Married    Spouse Name: N/A    Number of Children: N/A  . Years of Education: N/A   Occupational History  . Retired    Social History Main Topics  . Smoking status: Former Games developermoker  . Smokeless tobacco: Not on file     Comment: Quit in his 20's  . Alcohol Use: No  . Drug Use: Not on file  . Sexual Activity: Not on file   Other Topics Concern  . Not on file   Social History Narrative   Married and lives with wife    ROS: no fevers or chills, productive cough, hemoptysis, dysphasia, odynophagia, melena, hematochezia, dysuria, hematuria, rash, seizure activity, orthopnea, PND, pedal edema, claudication. Remaining systems are negative.  Physical Exam: Well-developed well-nourished in no acute distress.  Skin is warm and dry.  HEENT  is normal.  Neck is supple.  Chest is clear to auscultation with normal expansion.  Cardiovascular exam is regular rate and rhythm. 2/6 systolic murmur left sternal border. No diastolic murmur. Abdominal exam nontender or distended. No masses palpated. Extremities show no edema. neuro grossly intact  ECG Sinus Bradycardia at a rate of 52. Occasional PAC. No ST changes.

## 2014-03-16 NOTE — Assessment & Plan Note (Signed)
Blood pressure controlled. Continue present medications. Potassium and renal function monitored by primary care. 

## 2014-04-04 ENCOUNTER — Ambulatory Visit: Payer: Medicare Other | Admitting: Cardiology

## 2014-05-01 ENCOUNTER — Ambulatory Visit (HOSPITAL_COMMUNITY): Payer: Medicare Other | Attending: Cardiovascular Disease | Admitting: Radiology

## 2014-05-01 DIAGNOSIS — I10 Essential (primary) hypertension: Secondary | ICD-10-CM | POA: Diagnosis not present

## 2014-05-01 DIAGNOSIS — Z87891 Personal history of nicotine dependence: Secondary | ICD-10-CM | POA: Insufficient documentation

## 2014-05-01 DIAGNOSIS — I679 Cerebrovascular disease, unspecified: Secondary | ICD-10-CM | POA: Insufficient documentation

## 2014-05-01 DIAGNOSIS — I6529 Occlusion and stenosis of unspecified carotid artery: Secondary | ICD-10-CM | POA: Insufficient documentation

## 2014-05-01 DIAGNOSIS — E785 Hyperlipidemia, unspecified: Secondary | ICD-10-CM | POA: Diagnosis not present

## 2014-05-01 DIAGNOSIS — I658 Occlusion and stenosis of other precerebral arteries: Secondary | ICD-10-CM | POA: Insufficient documentation

## 2014-05-01 DIAGNOSIS — R0989 Other specified symptoms and signs involving the circulatory and respiratory systems: Secondary | ICD-10-CM | POA: Insufficient documentation

## 2014-05-01 NOTE — Progress Notes (Signed)
Carotid Duplex performed. 

## 2014-11-15 ENCOUNTER — Other Ambulatory Visit (HOSPITAL_COMMUNITY): Payer: Self-pay | Admitting: Cardiology

## 2014-11-15 DIAGNOSIS — I6523 Occlusion and stenosis of bilateral carotid arteries: Secondary | ICD-10-CM

## 2014-11-17 ENCOUNTER — Ambulatory Visit (HOSPITAL_COMMUNITY): Payer: Medicare Other | Attending: Cardiology | Admitting: Cardiology

## 2014-11-17 DIAGNOSIS — I10 Essential (primary) hypertension: Secondary | ICD-10-CM | POA: Insufficient documentation

## 2014-11-17 DIAGNOSIS — I2510 Atherosclerotic heart disease of native coronary artery without angina pectoris: Secondary | ICD-10-CM | POA: Diagnosis present

## 2014-11-17 DIAGNOSIS — Z951 Presence of aortocoronary bypass graft: Secondary | ICD-10-CM | POA: Insufficient documentation

## 2014-11-17 DIAGNOSIS — E785 Hyperlipidemia, unspecified: Secondary | ICD-10-CM | POA: Diagnosis not present

## 2014-11-17 DIAGNOSIS — I6523 Occlusion and stenosis of bilateral carotid arteries: Secondary | ICD-10-CM

## 2014-11-17 DIAGNOSIS — Z87891 Personal history of nicotine dependence: Secondary | ICD-10-CM | POA: Insufficient documentation

## 2014-11-17 NOTE — Progress Notes (Signed)
Carotid duplex performed 

## 2015-03-22 ENCOUNTER — Encounter (HOSPITAL_COMMUNITY): Payer: Self-pay | Admitting: Emergency Medicine

## 2015-03-22 ENCOUNTER — Emergency Department (HOSPITAL_COMMUNITY): Payer: Medicare Other

## 2015-03-22 ENCOUNTER — Emergency Department (HOSPITAL_COMMUNITY)
Admission: EM | Admit: 2015-03-22 | Discharge: 2015-03-22 | Disposition: A | Discharge status: Home / Self Care | Payer: Medicare Other | Attending: Emergency Medicine | Admitting: Emergency Medicine

## 2015-03-22 DIAGNOSIS — Z7982 Long term (current) use of aspirin: Secondary | ICD-10-CM | POA: Insufficient documentation

## 2015-03-22 DIAGNOSIS — I10 Essential (primary) hypertension: Secondary | ICD-10-CM | POA: Insufficient documentation

## 2015-03-22 DIAGNOSIS — E785 Hyperlipidemia, unspecified: Secondary | ICD-10-CM | POA: Diagnosis not present

## 2015-03-22 DIAGNOSIS — E860 Dehydration: Secondary | ICD-10-CM | POA: Diagnosis present

## 2015-03-22 DIAGNOSIS — I2510 Atherosclerotic heart disease of native coronary artery without angina pectoris: Secondary | ICD-10-CM | POA: Diagnosis not present

## 2015-03-22 DIAGNOSIS — D696 Thrombocytopenia, unspecified: Secondary | ICD-10-CM | POA: Diagnosis not present

## 2015-03-22 DIAGNOSIS — Z9049 Acquired absence of other specified parts of digestive tract: Secondary | ICD-10-CM | POA: Insufficient documentation

## 2015-03-22 DIAGNOSIS — Z951 Presence of aortocoronary bypass graft: Secondary | ICD-10-CM | POA: Insufficient documentation

## 2015-03-22 DIAGNOSIS — E871 Hypo-osmolality and hyponatremia: Secondary | ICD-10-CM | POA: Insufficient documentation

## 2015-03-22 DIAGNOSIS — R112 Nausea with vomiting, unspecified: Secondary | ICD-10-CM | POA: Insufficient documentation

## 2015-03-22 DIAGNOSIS — Z79899 Other long term (current) drug therapy: Secondary | ICD-10-CM | POA: Diagnosis not present

## 2015-03-22 DIAGNOSIS — N289 Disorder of kidney and ureter, unspecified: Secondary | ICD-10-CM | POA: Diagnosis not present

## 2015-03-22 DIAGNOSIS — Z87891 Personal history of nicotine dependence: Secondary | ICD-10-CM | POA: Diagnosis not present

## 2015-03-22 DIAGNOSIS — R531 Weakness: Secondary | ICD-10-CM | POA: Insufficient documentation

## 2015-03-22 LAB — COMPREHENSIVE METABOLIC PANEL
ALBUMIN: 4.1 g/dL (ref 3.5–5.0)
ALK PHOS: 131 U/L — AB (ref 38–126)
ALT: 118 U/L — ABNORMAL HIGH (ref 17–63)
AST: 177 U/L — ABNORMAL HIGH (ref 15–41)
Anion gap: 11 (ref 5–15)
BUN: 39 mg/dL — AB (ref 6–20)
CHLORIDE: 92 mmol/L — AB (ref 101–111)
CO2: 26 mmol/L (ref 22–32)
Calcium: 8.4 mg/dL — ABNORMAL LOW (ref 8.9–10.3)
Creatinine, Ser: 1.56 mg/dL — ABNORMAL HIGH (ref 0.61–1.24)
GFR calc Af Amer: 45 mL/min — ABNORMAL LOW (ref >60)
GFR, EST NON AFRICAN AMERICAN: 39 mL/min — AB (ref >60)
Glucose, Bld: 107 mg/dL — ABNORMAL HIGH (ref 65–99)
Potassium: 3.9 mmol/L (ref 3.5–5.1)
Sodium: 129 mmol/L — ABNORMAL LOW (ref 135–145)
Total Bilirubin: 1.8 mg/dL — ABNORMAL HIGH (ref 0.3–1.2)
Total Protein: 6.9 g/dL (ref 6.5–8.1)

## 2015-03-22 LAB — URINE MICROSCOPIC-ADD ON

## 2015-03-22 LAB — URINALYSIS, ROUTINE W REFLEX MICROSCOPIC
Bilirubin Urine: NEGATIVE
GLUCOSE, UA: NEGATIVE mg/dL
Hgb urine dipstick: NEGATIVE
KETONES UR: NEGATIVE mg/dL
Leukocytes, UA: NEGATIVE
NITRITE: NEGATIVE
SPECIFIC GRAVITY, URINE: 1.01 (ref 1.005–1.030)
UROBILINOGEN UA: 1 mg/dL (ref 0.0–1.0)
pH: 6 (ref 5.0–8.0)

## 2015-03-22 LAB — CBC WITH DIFFERENTIAL/PLATELET
Basophils Absolute: 0 10*3/uL (ref 0.0–0.1)
Basophils Relative: 1 % (ref 0–1)
Eosinophils Absolute: 0 10*3/uL (ref 0.0–0.7)
Eosinophils Relative: 0 % (ref 0–5)
HCT: 41.2 % (ref 39.0–52.0)
HEMOGLOBIN: 14.7 g/dL (ref 13.0–17.0)
Lymphocytes Relative: 14 % (ref 12–46)
Lymphs Abs: 0.4 10*3/uL — ABNORMAL LOW (ref 0.7–4.0)
MCH: 35.3 pg — ABNORMAL HIGH (ref 26.0–34.0)
MCHC: 35.7 g/dL (ref 30.0–36.0)
MCV: 99 fL (ref 78.0–100.0)
Monocytes Absolute: 0.1 10*3/uL (ref 0.1–1.0)
Monocytes Relative: 4 % (ref 3–12)
NEUTROS ABS: 2.3 10*3/uL (ref 1.7–7.7)
Neutrophils Relative %: 82 % — ABNORMAL HIGH (ref 43–77)
PLATELETS: 70 10*3/uL — AB (ref 150–400)
RBC: 4.16 MIL/uL — ABNORMAL LOW (ref 4.22–5.81)
RDW: 12.8 % (ref 11.5–15.5)
WBC: 2.8 10*3/uL — ABNORMAL LOW (ref 4.0–10.5)

## 2015-03-22 LAB — I-STAT CG4 LACTIC ACID, ED: Lactic Acid, Venous: 1.17 mmol/L (ref 0.5–2.0)

## 2015-03-22 LAB — LIPASE, BLOOD: Lipase: 45 U/L (ref 22–51)

## 2015-03-22 MED ORDER — ONDANSETRON HCL 4 MG/2ML IJ SOLN
4.0000 mg | Freq: Once | INTRAMUSCULAR | Status: AC
  Administered 2015-03-22: 4 mg via INTRAVENOUS
  Filled 2015-03-22: qty 2

## 2015-03-22 MED ORDER — ONDANSETRON 8 MG PO TBDP: 8.0000 mg | ORAL_TABLET | Freq: Three times a day (TID) | ORAL | Status: DC | PRN

## 2015-03-22 MED ORDER — SODIUM CHLORIDE 0.9 % IV SOLN
1000.0000 mL | INTRAVENOUS | Status: DC
  Administered 2015-03-22: 1000 mL via INTRAVENOUS

## 2015-03-22 MED ORDER — DOXYCYCLINE HYCLATE 100 MG PO TABS: 100.0000 mg | ORAL_TABLET | Freq: Two times a day (BID) | ORAL | Status: DC

## 2015-03-22 MED ORDER — SODIUM CHLORIDE 0.9 % IV SOLN
1000.0000 mL | Freq: Once | INTRAVENOUS | Status: AC
  Administered 2015-03-22: 1000 mL via INTRAVENOUS

## 2015-03-22 NOTE — ED Notes (Signed)
Pt reports nausea since Tues, emesis yesterday. Was seen by PCP yesterday, called today and told he was dehydrated.

## 2015-03-22 NOTE — ED Provider Notes (Signed)
CSN: [id number]     Arrival date & time 03/22/15  1717 History   First MD Initiated Contact with Patient 03/22/15 1727     Chief Complaint  Patient presents with  . Dehydration   HPI Patient presents to the emergency room with complaints of nausea vomiting.  Patient states yesterday started to have some general malaise and feelings of weakness. He had been out working in the yard achiness family attributed the symptoms possibly due to dehydration. He went to his primary care doctor's office and was evaluated. A did some laboratory testing. He was told she might be dehydrated and they gave him a regimen for oral rehydration. Later in the afternoon after going home from the doctor's office, the patient started having trouble with nausea and vomiting. He had several episodes throughout the evening. Intermittently he was able to keep down some fluids. He seemed to rest well through the night but this morning he had some more nausea. He also had weakness. He spoke with the doctor's office. Told he was dehydrated with a recommended he come to the emergency room for evaluation.  He denies any trouble with cough or dysuria. Previously he had some abdominal discomfort but does not have any right now. Past Medical History  Diagnosis Date  . CAD (coronary artery disease)     5/10, myoview: mod inferapical ischemia; 03/07/09 cath 3vd, EF 65%, 30-35 mmHg peak to Ao Valve gradient;  . Essential hypertension, benign   . Hyperlipidemia   . Aortic stenosis    Past Surgical History  Procedure Laterality Date  . Hernia repair      x3  . Cholecystectomy    . Back surgery    . Coronary artery bypass graft  03/14/2009    x3, LIMA - LAD, SVG - Diag, SVG - RCA  . Cardiac catheterization  03/07/2009    3vd. EF 65%, 30-35 mmHm peak Ao Valve gradient  . Aortic valve replacement  03/14/2009    using a 23 mm Edwards pericardial valve   Family History  Problem Relation Age of Onset  . Heart failure Mother   .  Stroke Father   . Heart attack Brother 34  . Hodgkin's lymphoma Brother    History  Substance Use Topics  . Smoking status: Former Games developer  . Smokeless tobacco: Not on file     Comment: Quit in his 20's  . Alcohol Use: No    Review of Systems  All other systems reviewed and are negative.     Allergies  Bactrim  Home Medications   Prior to Admission medications   Medication Sig Start Date End Date Taking? Authorizing Provider  aspirin 325 MG tablet Take 325 mg by mouth daily.      Historical Provider, MD  Docusate Calcium (STOOL SOFTENER PO) Take by mouth as needed.    Historical Provider, MD  Fexofenadine HCl (ALLEGRA PO) Take by mouth as needed.    Historical Provider, MD  lisinopril-hydrochlorothiazide (PRINZIDE,ZESTORETIC) 10-12.5 MG per tablet Take 1 tablet by mouth daily.    Historical Provider, MD  metoprolol tartrate (LOPRESSOR) 25 MG tablet Take 0.5 tablets (12.5 mg total) by mouth 2 (two) times daily. 06/21/13   Lewayne Bunting, MD  Multiple Vitamin (MULTIVITAMIN) tablet Take 1 tablet by mouth daily.      Historical Provider, MD  nitroGLYCERIN (NITROSTAT) 0.4 MG SL tablet Place 1 tablet (0.4 mg total) under the tongue every 5 (five) minutes as needed. May repeat for up to 3  doses. 04/02/12   Lewayne Bunting, MD  Omega-3 Fatty Acids (FISH OIL) 1200 MG CAPS Take by mouth daily.      Historical Provider, MD  simvastatin (ZOCOR) 40 MG tablet Take 1 tablet (40 mg total) by mouth daily. 06/21/13   Lewayne Bunting, MD   BP 101/50 mmHg  Pulse 78  Temp(Src) 100.9 F (38.3 C) (Oral)  Resp 20  Ht  (1.702 m)  Wt 133 lb (60.328 kg)  BMI 20.83 kg/m2  SpO2 97% Physical Exam  Constitutional: No distress.  HENT:  Head: Normocephalic and atraumatic.  Right Ear: External ear normal.  Left Ear: External ear normal.  Mouth/Throat: No oropharyngeal exudate.  Eyes: Conjunctivae are normal. Right eye exhibits no discharge. Left eye exhibits no discharge. No scleral icterus.   Neck: Neck supple. No tracheal deviation present.  Cardiovascular: Normal rate, regular rhythm and intact distal pulses.   Pulmonary/Chest: Effort normal and breath sounds normal. No stridor. No respiratory distress. He has no wheezes. He has no rales.  Abdominal: Soft. Bowel sounds are normal. He exhibits no distension. There is no tenderness. There is no rebound and no guarding.  Musculoskeletal: He exhibits no edema or tenderness.  Neurological: He is alert. He has normal strength. No cranial nerve deficit (no facial droop, extraocular movements intact, no slurred speech) or sensory deficit. He exhibits normal muscle tone. He displays no seizure activity. Coordination normal.  Skin: Skin is warm and dry. No rash noted. He is not diaphoretic.  Psychiatric: He has a normal mood and affect.  Nursing note and vitals reviewed.   ED Course  Procedures (including critical care time) Labs Review Labs Reviewed  CBC WITH DIFFERENTIAL/PLATELET - Abnormal; Notable for the following:    WBC 2.8 (*)    RBC 4.16 (*)    MCH 35.3 (*)    Platelets 70 (*)    Neutrophils Relative % 82 (*)    Lymphs Abs 0.4 (*)    All other components within normal limits  COMPREHENSIVE METABOLIC PANEL - Abnormal; Notable for the following:    Sodium 129 (*)    Chloride 92 (*)    Glucose, Bld 107 (*)    BUN 39 (*)    Creatinine, Ser 1.56 (*)    Calcium 8.4 (*)    AST 177 (*)    ALT 118 (*)    Alkaline Phosphatase 131 (*)    Total Bilirubin 1.8 (*)    GFR calc non Af Amer 39 (*)    GFR calc Af Amer 45 (*)    All other components within normal limits  URINALYSIS, ROUTINE W REFLEX MICROSCOPIC (NOT AT [facility]) - Abnormal; Notable for the following:    Protein, ur TRACE (*)    All other components within normal limits  URINE MICROSCOPIC-ADD ON - Abnormal; Notable for the following:    Squamous Epithelial / LPF FEW (*)    Bacteria, UA FEW (*)    All other components within normal limits  CULTURE, BLOOD (ROUTINE X  2)  URINE CULTURE  CULTURE, BLOOD (ROUTINE X 2)  LIPASE, BLOOD  I-STAT CG4 LACTIC ACID, ED    Imaging Review Ct Abdomen Pelvis Wo Contrast  03/22/2015   CLINICAL DATA:  Nausea and vomiting for 2 days.  EXAM: CT ABDOMEN AND PELVIS WITHOUT CONTRAST  TECHNIQUE: Multidetector CT imaging of the abdomen and pelvis was performed following the standard protocol without IV contrast.  COMPARISON:  03/22/2015 radiographs.  FINDINGS: There are unremarkable unenhanced appearances  of the liver, spleen, pancreas and adrenals. There is prior cholecystectomy. There is no bile duct dilatation.  There is a 1.8 cm low-attenuation lesion in the upper right renal hilum. This is unchanged from the lumbar MRI of 09/25/2010 and likely is a benign cyst. The kidneys are otherwise remarkable only for a 2 mm upper pole left collecting system calculus and a 3 mm midpole right collecting system calculus. Ureters and urinary bladder appear unremarkable.  The abdominal aorta is normal in caliber with moderate atherosclerotic calcification.  Bowel and mesentery appear unremarkable.  There is prominent prostatic enlargement with associated elevation and indentation of the bladder base.  No acute inflammatory changes are evident in the abdomen or pelvis. There is no adenopathy. There is no ascites.  There is no significant skeletal lesion. Moderately severe degenerative disc changes are present in the lumbar spine from L3 through S1 and there also is moderate facet arthritis from L4 through S1.  There is no significant abnormality in the lower chest.  IMPRESSION: 1. No acute findings are evident in the abdomen or pelvis. 2. Nephrolithiasis 3. Prostatic enlargement 4. Lumbar degenerative disc and facet changes.   Electronically Signed   By: Ellery Plunk M.D.   On: 03/22/2015 22:16   Dg Abd Acute W/chest  03/22/2015   CLINICAL DATA:  Nausea for 3 days.  Emesis.  Dehydration.  EXAM: DG ABDOMEN ACUTE W/ 1V CHEST  COMPARISON:  Chest  radiograph 04/24/2009.  FINDINGS: There is no evidence of dilated bowel loops or free intraperitoneal air. No radiopaque calculi or other significant radiographic abnormality is seen. Prior cholecystectomy. Lumbar degenerative change. Heart size and mediastinal contours are within normal limits. Lungs are free of infiltrates or edema. Hyperinflation. Prior CABG. Basilar scarring. Aortic atherosclerosis. No pneumothorax.  IMPRESSION: Negative abdominal radiographs. No acute cardiopulmonary disease. COPD.   Electronically Signed   By: Davonna Belling M.D.   On: 03/22/2015 18:43      MDM   Final diagnoses:  Thrombocytopenia  Hyponatremia  Acute renal insufficiency   Labs show renal insufficiency associated with elevated lfts.  CT scan does not show any acute abnormalities.  No known tick bites but he has been outside working in the yard.  Will send off RMSF titers.  Will start on oral doxycycline. THrombocytopenia is chronic.  His hyponatremia is new as are the lfts.  Viral is possible as well. Pt is feeling better now.  Tolerating oral fluids.  Would like to go home.  Plan to follow up with PCP     Linwood Dibbles, MD 03/22/15 [id number]

## 2015-03-22 NOTE — Discharge Instructions (Signed)
Dehydration, Adult Dehydration is when you lose more fluids from the body than you take in. Vital organs like the kidneys, brain, and heart cannot function without a proper amount of fluids and salt. Any loss of fluids from the body can cause dehydration.  CAUSES   Vomiting.  Diarrhea.  Excessive sweating.  Excessive urine output.  Fever. SYMPTOMS  Mild dehydration  Thirst.  Dry lips.  Slightly dry mouth. Moderate dehydration  Very dry mouth.  Sunken eyes.  Skin does not bounce back quickly when lightly pinched and released.  Dark urine and decreased urine production.  Decreased tear production.  Headache. Severe dehydration  Very dry mouth.  Extreme thirst.  Rapid, weak pulse (more than 100 beats per minute at rest).  Cold hands and feet.  Not able to sweat in spite of heat and temperature.  Rapid breathing.  Blue lips.  Confusion and lethargy.  Difficulty being awakened.  Minimal urine production.  No tears. DIAGNOSIS  Your caregiver will diagnose dehydration based on your symptoms and your exam. Blood and urine tests will help confirm the diagnosis. The diagnostic evaluation should also identify the cause of dehydration. TREATMENT  Treatment of mild or moderate dehydration can often be done at home by increasing the amount of fluids that you drink. It is best to drink small amounts of fluid more often. Drinking too much at one time can make vomiting worse. Refer to the home care instructions below. Severe dehydration needs to be treated at the hospital where you will probably be given intravenous (IV) fluids that contain water and electrolytes. HOME CARE INSTRUCTIONS   Ask your caregiver about specific rehydration instructions.  Drink enough fluids to keep your urine clear or pale yellow.  Drink small amounts frequently if you have nausea and vomiting.  Eat as you normally do.  Avoid:  Foods or drinks high in sugar.  Carbonated  drinks.  Juice.  Extremely hot or cold fluids.  Drinks with caffeine.  Fatty, greasy foods.  Alcohol.  Tobacco.  Overeating.  Gelatin desserts.  Wash your hands well to avoid spreading bacteria and viruses.  Only take over-the-counter or prescription medicines for pain, discomfort, or fever as directed by your caregiver.  Ask your caregiver if you should continue all prescribed and over-the-counter medicines.  Keep all follow-up appointments with your caregiver. SEEK MEDICAL CARE IF:  You have abdominal pain and it increases or stays in one area (localizes).  You have a rash, stiff neck, or severe headache.  You are irritable, sleepy, or difficult to awaken.  You are weak, dizzy, or extremely thirsty. SEEK IMMEDIATE MEDICAL CARE IF:   You are unable to keep fluids down or you get worse despite treatment.  You have frequent episodes of vomiting or diarrhea.  You have blood or green matter (bile) in your vomit.  You have blood in your stool or your stool looks black and tarry.  You have not urinated in 6 to 8 hours, or you have only urinated a small amount of very dark urine.  You have a fever.  You faint. MAKE SURE YOU:   Understand these instructions.  Will watch your condition.  Will get help right away if you are not doing well or get worse. Document Released: 09/29/2005 Document Revised: 12/22/2011 Document Reviewed: 05/19/2011 Exit[employer] Patient Information 2015 [employer]. This information is not intended to replace advice given to you by your health care provider. Make sure you discuss any questions you have with your health care   provider.  

## 2015-03-24 LAB — URINE CULTURE
COLONY COUNT: NO GROWTH
CULTURE: NO GROWTH

## 2015-03-24 LAB — HEPATITIS PANEL, ACUTE
Hep A IgM: NEGATIVE
Hep B C IgM: NEGATIVE
Hepatitis B Surface Ag: NEGATIVE

## 2015-03-27 LAB — RMSF, IGG, IFA: RMSF, IGG, IFA: 1:64 {titer} — ABNORMAL HIGH

## 2015-03-27 LAB — CULTURE, BLOOD (ROUTINE X 2)
Culture: NO GROWTH
Culture: NO GROWTH

## 2015-03-27 LAB — ROCKY MTN SPOTTED FVR ABS PNL(IGG+IGM)
RMSF IgG: POSITIVE — AB
RMSF IgM: 0.29 index (ref 0.00–0.89)

## 2015-04-30 NOTE — Progress Notes (Signed)
HPI: FU CAD/AVR; coronary artery bypass grafting x3 as well as aortic valve replacement with a pericardial tissue valve in May of 2010. Abdominal ultrasound in July of 2013 showed no aneurysm. Last echocardiogram in July 2014 showed normal LV function and a normally functioning prosthetic aortic valve with a mean gradient of 11 mm of mercury; mild RAE/RVE. Carotid dopplers 2/16 showed 60-79 left stenosis and 40-59 right and fu recommended in six months. Patient seen in the ER in June and felt to have rocky [place] spotted fever. Treated with doxycycline. Noted to have hyponatremia, renal insufficiency and increased liver functions. Since I last saw him patient denies dyspnea, chest pain, palpitations or syncope.  Current Outpatient Prescriptions  Medication Sig Dispense Refill  . aspirin 325 MG tablet Take 325 mg by mouth daily.      Marland Kitchen docusate sodium (COLACE) 100 MG capsule Take 100 mg by mouth daily as needed for mild constipation.    . fexofenadine (ALLEGRA) 180 MG tablet Take 180 mg by mouth daily.    . metoprolol tartrate (LOPRESSOR) 25 MG tablet Take 0.5 tablets (12.5 mg total) by mouth 2 (two) times daily. 180 tablet 3  . multivitamin-iron-minerals-folic acid (CENTRUM) chewable tablet Chew 1 tablet by mouth daily.    . nitroGLYCERIN (NITROSTAT) 0.4 MG SL tablet Place 1 tablet (0.4 mg total) under the tongue every 5 (five) minutes as needed. May repeat for up to 3 doses. (Patient taking differently: Place 0.4 mg under the tongue every 5 (five) minutes as needed for chest pain. May repeat for up to 3 doses.) 25 tablet 12  . Omega-3 Fatty Acids (FISH OIL) 1200 MG CAPS Take 1,200 mg by mouth daily.     . simvastatin (ZOCOR) 40 MG tablet Take 1 tablet (40 mg total) by mouth daily. (Patient taking differently: Take 20 mg by mouth daily. ) 90 tablet 3   No current facility-administered medications for this visit.     Past Medical History  Diagnosis Date  . CAD (coronary artery disease)      5/10, myoview: mod inferapical ischemia; 03/07/09 cath 3vd, EF 65%, 30-35 mmHg peak to Ao Valve gradient;  . Essential hypertension, benign   . Hyperlipidemia   . Aortic stenosis     Past Surgical History  Procedure Laterality Date  . Hernia repair      x3  . Cholecystectomy    . Back surgery    . Coronary artery bypass graft  03/14/2009    x3, LIMA - LAD, SVG - Diag, SVG - RCA  . Cardiac catheterization  03/07/2009    3vd. EF 65%, 30-35 mmHm peak Ao Valve gradient  . Aortic valve replacement  03/14/2009    using a 23 mm Edwards pericardial valve    History   Social History  . Marital Status: Married    Spouse Name: N/A  . Number of Children: N/A  . Years of Education: N/A   Occupational History  . Retired    Social History Main Topics  . Smoking status: Former Games developer  . Smokeless tobacco: Not on file     Comment: Quit in his 20's  . Alcohol Use: No  . Drug Use: Not on file  . Sexual Activity: Not on file   Other Topics Concern  . Not on file   Social History Narrative   Married and lives with wife    ROS: no fevers or chills, productive cough, hemoptysis, dysphasia, odynophagia, melena, hematochezia, dysuria, hematuria, rash,  seizure activity, orthopnea, PND, pedal edema, claudication. Remaining systems are negative.  Physical Exam: Well-developed well-nourished in no acute distress.  Skin is warm and dry.  HEENT is normal.  Neck is supple.  Chest is clear to auscultation with normal expansion.  Cardiovascular exam is regular rate and rhythm. 2/6 systolic murmur Abdominal exam nontender or distended. No masses palpated. Extremities show no edema. neuro grossly intact  ECG 03/22/2015-sinus rhythm with no ST changes.

## 2015-05-03 ENCOUNTER — Ambulatory Visit (INDEPENDENT_AMBULATORY_CARE_PROVIDER_SITE_OTHER): Payer: Medicare Other | Admitting: Cardiology

## 2015-05-03 ENCOUNTER — Encounter: Payer: Self-pay | Admitting: Cardiology

## 2015-05-03 ENCOUNTER — Encounter: Payer: Self-pay | Admitting: *Deleted

## 2015-05-03 VITALS — BP 110/56 | HR 55 | Ht 67.0 in | Wt 143.1 lb

## 2015-05-03 DIAGNOSIS — I10 Essential (primary) hypertension: Secondary | ICD-10-CM | POA: Diagnosis not present

## 2015-05-03 DIAGNOSIS — I359 Nonrheumatic aortic valve disorder, unspecified: Secondary | ICD-10-CM | POA: Diagnosis not present

## 2015-05-03 DIAGNOSIS — I6523 Occlusion and stenosis of bilateral carotid arteries: Secondary | ICD-10-CM | POA: Diagnosis not present

## 2015-05-03 DIAGNOSIS — I2510 Atherosclerotic heart disease of native coronary artery without angina pectoris: Secondary | ICD-10-CM

## 2015-05-03 DIAGNOSIS — I679 Cerebrovascular disease, unspecified: Secondary | ICD-10-CM

## 2015-05-03 NOTE — Patient Instructions (Signed)
Your physician wants you to follow-up in: ONE YEAR WITH DR Shelda Pal will receive a reminder letter in the mail two months in advance. If you don't receive a letter, please call our office to schedule the follow-up appointment.   Your physician has requested that you have a carotid duplex. This test is an ultrasound of the carotid arteries in your neck. It looks at blood flow through these arteries that supply the brain with blood. Allow one hour for this exam. There are no restrictions or special instructions.DUE IN [place]

## 2015-05-03 NOTE — Assessment & Plan Note (Signed)
Blood pressure controlled. Continue present medications. 

## 2015-05-03 NOTE — Assessment & Plan Note (Signed)
Schedule follow-up carotid Dopplers August 2016.

## 2015-05-03 NOTE — Assessment & Plan Note (Signed)
Continue statin. 

## 2015-05-03 NOTE — Addendum Note (Signed)
Addended by: Raelyn Number on: 05/03/2015 09:15 AM   Modules accepted: Orders

## 2015-05-03 NOTE — Assessment & Plan Note (Signed)
Continue aspirin and statin. 

## 2015-05-03 NOTE — Assessment & Plan Note (Signed)
Status post aortic valve replacement. Continue SBE prophylaxis. 

## 2015-05-29 ENCOUNTER — Ambulatory Visit (HOSPITAL_COMMUNITY)
Admission: RE | Admit: 2015-05-29 | Discharge: 2015-05-29 | Disposition: A | Discharge status: Home / Self Care | Payer: Medicare Other | Source: Ambulatory Visit | Attending: Cardiovascular Disease | Admitting: Cardiovascular Disease

## 2015-05-29 ENCOUNTER — Other Ambulatory Visit: Payer: Self-pay | Admitting: *Deleted

## 2015-05-29 DIAGNOSIS — I6523 Occlusion and stenosis of bilateral carotid arteries: Secondary | ICD-10-CM

## 2016-04-29 NOTE — Progress Notes (Signed)
HPI: FU CAD/AVR; coronary artery bypass grafting x3 as well as aortic valve replacement with a pericardial tissue valve in May of 2010. Abdominal ultrasound in July of 2013 showed no aneurysm. Last echocardiogram in July 2014 showed normal LV function and a normally functioning prosthetic aortic valve with a mean gradient of 11 mm of mercury; mild RAE/RVE. Carotid dopplers 8/16 showed 60-79 left stenosis and 40-59 right and fu recommended in 12 months. Since I last saw him the patient has dyspnea with more extreme activities but not with routine activities. It is relieved with rest. It is not associated with chest pain. There is no orthopnea, PND or pedal edema. There is no syncope or palpitations. There is no exertional chest pain.   Current Outpatient Prescriptions  Medication Sig Dispense Refill  . aspirin 325 MG tablet Take 325 mg by mouth daily.      Marland Kitchen. docusate sodium (COLACE) 100 MG capsule Take 100 mg by mouth daily as needed for mild constipation.    . fexofenadine (ALLEGRA) 180 MG tablet Take 180 mg by mouth daily.    . metoprolol tartrate (LOPRESSOR) 25 MG tablet Take 0.5 tablets (12.5 mg total) by mouth 2 (two) times daily. 180 tablet 3  . multivitamin-iron-minerals-folic acid (CENTRUM) chewable tablet Chew 1 tablet by mouth daily.    . nitroGLYCERIN (NITROSTAT) 0.4 MG SL tablet Place 1 tablet (0.4 mg total) under the tongue every 5 (five) minutes as needed. May repeat for up to 3 doses. (Patient taking differently: Place 0.4 mg under the tongue every 5 (five) minutes as needed for chest pain. May repeat for up to 3 doses.) 25 tablet 12  . Omega-3 Fatty Acids (FISH OIL) 1200 MG CAPS Take 1,200 mg by mouth daily.     . simvastatin (ZOCOR) 40 MG tablet Take 1 tablet (40 mg total) by mouth daily. (Patient taking differently: Take 20 mg by mouth daily. ) 90 tablet 3   No current facility-administered medications for this visit.     Past Medical History  Diagnosis Date  . CAD  (coronary artery disease)     5/10, myoview: mod inferapical ischemia; 03/07/09 cath 3vd, EF 65%, 30-35 mmHg peak to Ao Valve gradient;  . Essential hypertension, benign   . Hyperlipidemia   . Aortic stenosis     Past Surgical History  Procedure Laterality Date  . Hernia repair      x3  . Cholecystectomy    . Back surgery    . Coronary artery bypass graft  03/14/2009    x3, LIMA - LAD, SVG - Diag, SVG - RCA  . Cardiac catheterization  03/07/2009    3vd. EF 65%, 30-35 mmHm peak Ao Valve gradient  . Aortic valve replacement  03/14/2009    using a 23 mm Edwards pericardial valve    Social History   Social History  . Marital Status: Married    Spouse Name: N/A  . Number of Children: N/A  . Years of Education: N/A   Occupational History  . Retired    Social History Main Topics  . Smoking status: Former Games developermoker  . Smokeless tobacco: Not on file     Comment: Quit in his 20's  . Alcohol Use: No  . Drug Use: Not on file  . Sexual Activity: Not on file   Other Topics Concern  . Not on file   Social History Narrative   Married and lives with wife    Family History  Problem  Relation Age of Onset  . Heart failure Mother   . Stroke Father   . Heart attack Brother 65  . Hodgkin's lymphoma Brother     ROS: no fevers or chills, productive cough, hemoptysis, dysphasia, odynophagia, melena, hematochezia, dysuria, hematuria, rash, seizure activity, orthopnea, PND, pedal edema, claudication. Remaining systems are negative.  Physical Exam: Well-developed well-nourished in no acute distress.  Skin is warm and dry.  HEENT is normal.  Neck is supple.  Chest is clear to auscultation with normal expansion.  Cardiovascular exam is regular rate and rhythm. 2/6 systolic murmur left sternal border.  Abdominal exam nontender or distended. No masses palpated. Extremities show no edema. neuro grossly intact  ECG Sinus bradycardia at a rate of 47. First-degree A-V block. No ST  changes.  A/P  1 Hyperlipidemia-continue statin. Lipids and liver monitored by primary care.  2 hypertension-blood pressure is controlled. However he is mildly bradycardic. Discontinue metoprolol and follow.  3 coronary artery disease-continue aspirin and statin.  4 status post aortic valve replacement-no change in murmur. No symptoms. Continue SBE prophylaxis.  5 carotid artery disease-follow-up carotid Dopplers August 2017. Continue aspirin and statin.  Olga Millers, MD

## 2016-05-01 ENCOUNTER — Encounter: Payer: Self-pay | Admitting: Cardiology

## 2016-05-01 ENCOUNTER — Ambulatory Visit (INDEPENDENT_AMBULATORY_CARE_PROVIDER_SITE_OTHER): Payer: Medicare Other | Admitting: Cardiology

## 2016-05-01 DIAGNOSIS — I2510 Atherosclerotic heart disease of native coronary artery without angina pectoris: Secondary | ICD-10-CM | POA: Diagnosis not present

## 2016-05-01 DIAGNOSIS — I679 Cerebrovascular disease, unspecified: Secondary | ICD-10-CM

## 2016-05-01 DIAGNOSIS — I10 Essential (primary) hypertension: Secondary | ICD-10-CM | POA: Diagnosis not present

## 2016-05-01 DIAGNOSIS — I359 Nonrheumatic aortic valve disorder, unspecified: Secondary | ICD-10-CM | POA: Diagnosis not present

## 2016-05-01 DIAGNOSIS — E785 Hyperlipidemia, unspecified: Secondary | ICD-10-CM

## 2016-05-01 NOTE — Addendum Note (Signed)
Addended by: Oleta MouseVERTON, SHANA M on: 05/01/2016 08:59 AM   Modules accepted: Orders

## 2016-05-01 NOTE — Patient Instructions (Signed)
Medication Instructions:   STOP TAKING METOPROLOL  If you need a refill on your cardiac medications before your next appointment, please call your pharmacy.  Labwork: NONE ORDER TODAY    Testing/Procedures: NONE ORDER TODAY    Follow-Up: Your physician wants you to follow-up in: ONE YEAR WITH CRENSHAW   You will receive a reminder letter in the mail two months in advance. If you don't receive a letter, please call our office to schedule the follow-up appointment.     Any Other Special Instructions Will Be Listed Below (If Applicable).

## 2016-05-13 ENCOUNTER — Ambulatory Visit (HOSPITAL_COMMUNITY)
Admission: RE | Admit: 2016-05-13 | Discharge: 2016-05-13 | Disposition: A | Discharge status: Home / Self Care | Payer: Medicare Other | Source: Ambulatory Visit | Attending: Cardiology | Admitting: Cardiology

## 2016-05-13 DIAGNOSIS — E785 Hyperlipidemia, unspecified: Secondary | ICD-10-CM | POA: Diagnosis not present

## 2016-05-13 DIAGNOSIS — I6523 Occlusion and stenosis of bilateral carotid arteries: Secondary | ICD-10-CM | POA: Insufficient documentation

## 2016-05-13 DIAGNOSIS — I2510 Atherosclerotic heart disease of native coronary artery without angina pectoris: Secondary | ICD-10-CM | POA: Insufficient documentation

## 2016-05-13 DIAGNOSIS — I10 Essential (primary) hypertension: Secondary | ICD-10-CM | POA: Insufficient documentation

## 2016-06-22 IMAGING — CT CT ABD-PELV W/O CM
2 of 4 series · 16 of 46 positions shown, 18 images · non-contrast
Comparison: 03/22/2015 radiographs.

CLINICAL DATA: Nausea and vomiting for 2 days.

EXAM:
CT ABDOMEN AND PELVIS WITHOUT CONTRAST
TECHNIQUE: Multidetector CT imaging of the abdomen and pelvis was performed
following the standard protocol without IV contrast.

[Series 2: abdomen/pelvis w/o contrast · axial · non-contrast · 0.72mm/px · z∈[-498,-74]mm · 13 of 93 slices shown, 15 images]
[im 4/93  soft-tissue]
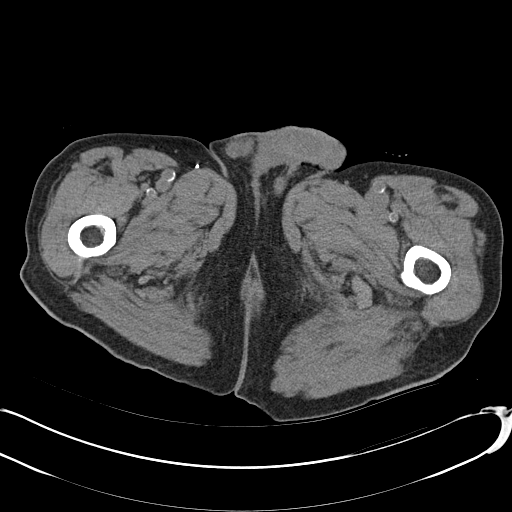
[im 4/93  bone]
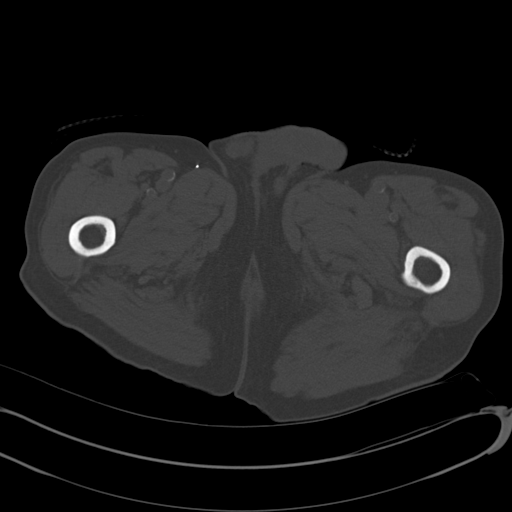
[im 12/93  soft-tissue]
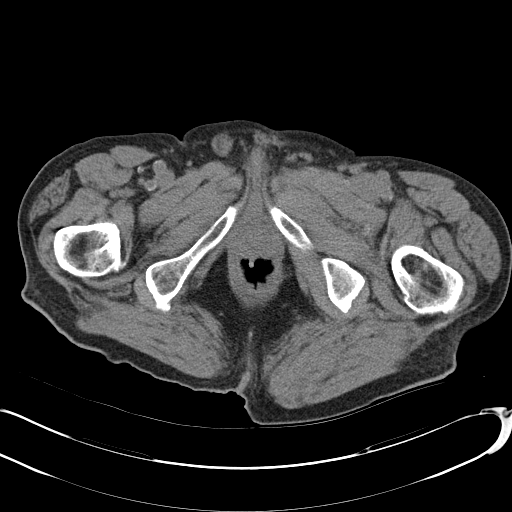
[im 20/93  soft-tissue]
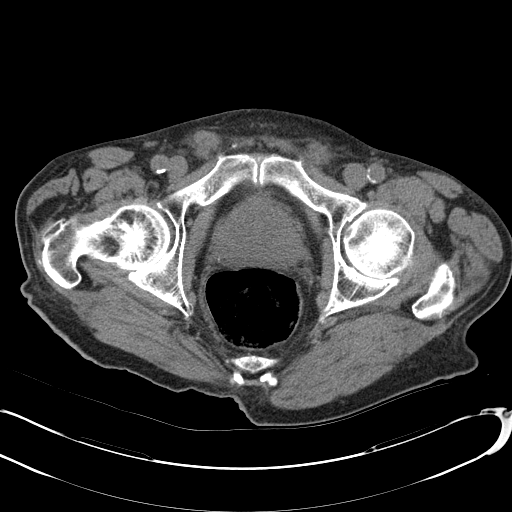
[im 27/93  soft-tissue]
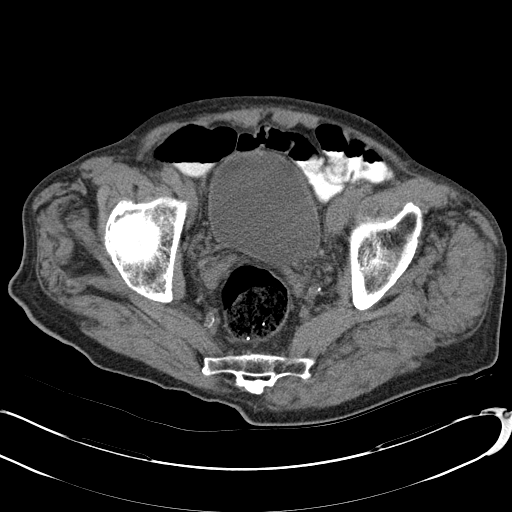
[im 31/93  soft-tissue]
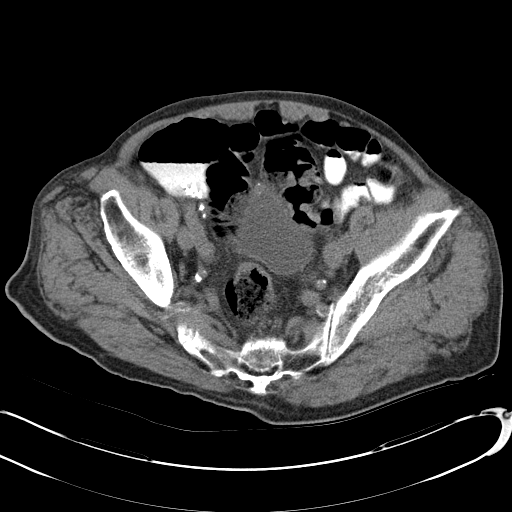
[im 39/93  soft-tissue]
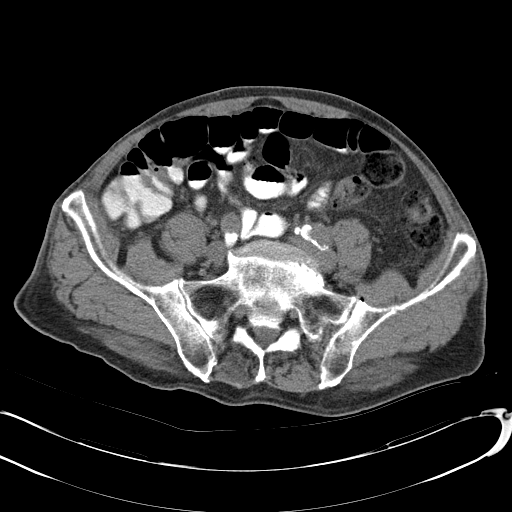
[im 47/93  soft-tissue]
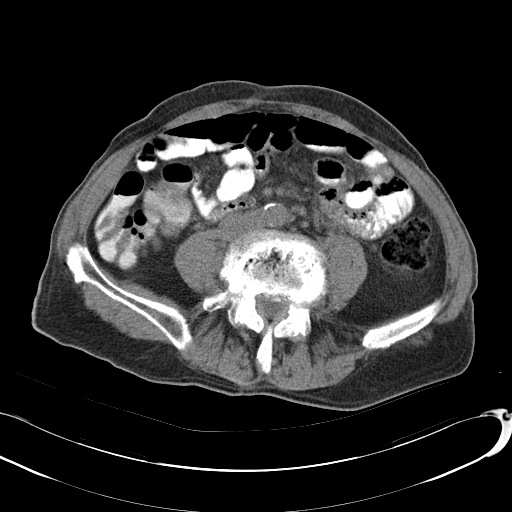
[im 54/93  soft-tissue]
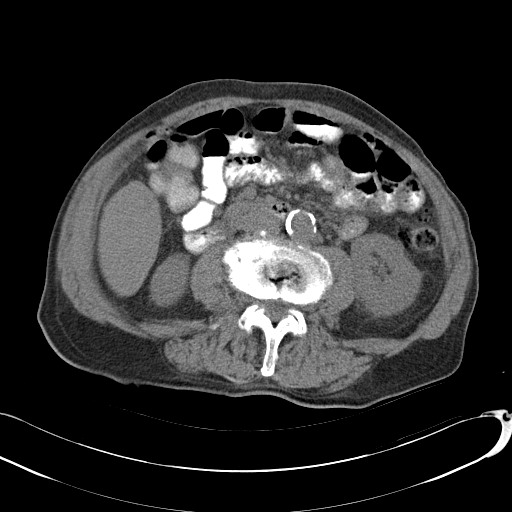
[im 62/93  soft-tissue]
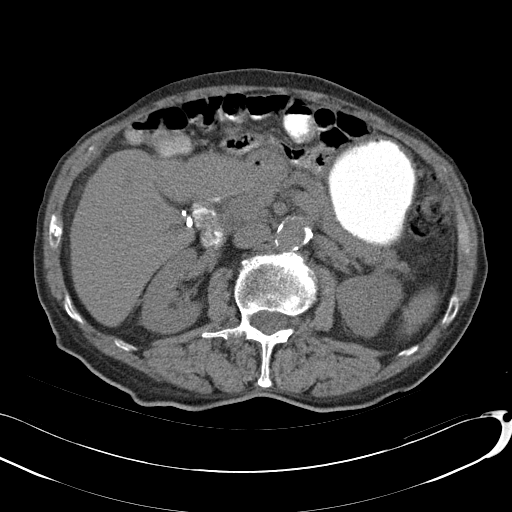
[im 62/93  bone]
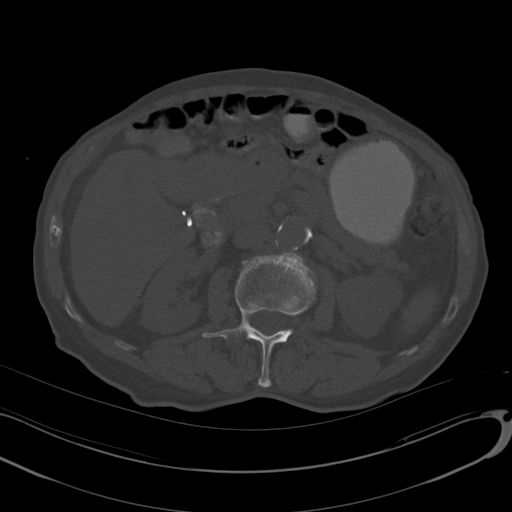
[im 66/93  soft-tissue]
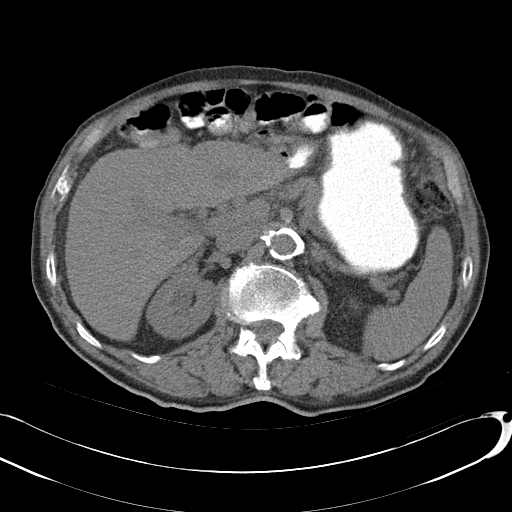
[im 73/93  soft-tissue]
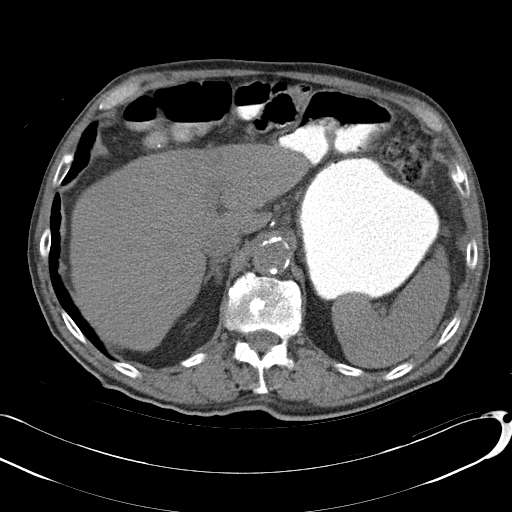
[im 81/93  soft-tissue]
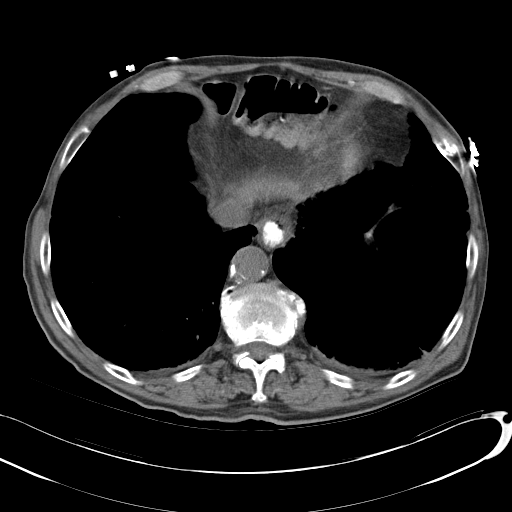
[im 89/93  soft-tissue]
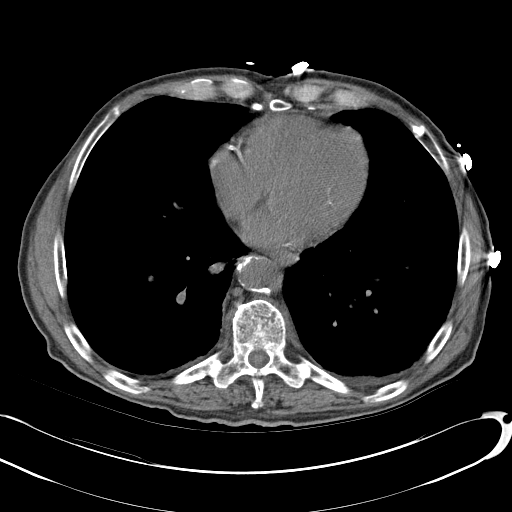

[Series 4: mpr cor 3.0mm · coronal · 0.71mm/px · 3 of 78 slices shown]
[im 26/78  soft-tissue]
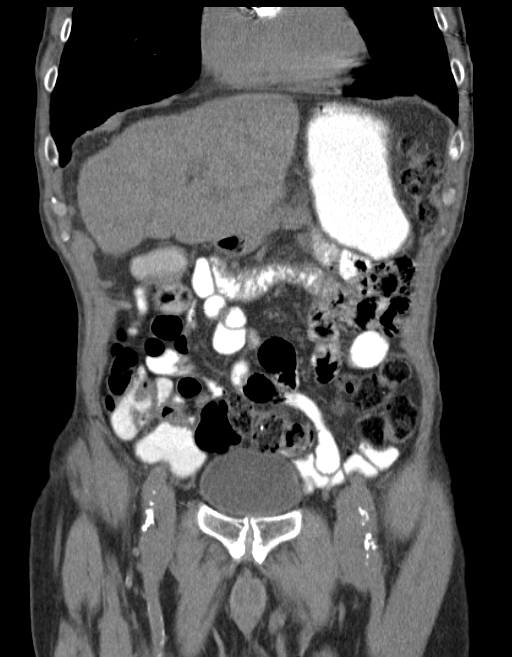
[im 35/78  soft-tissue]
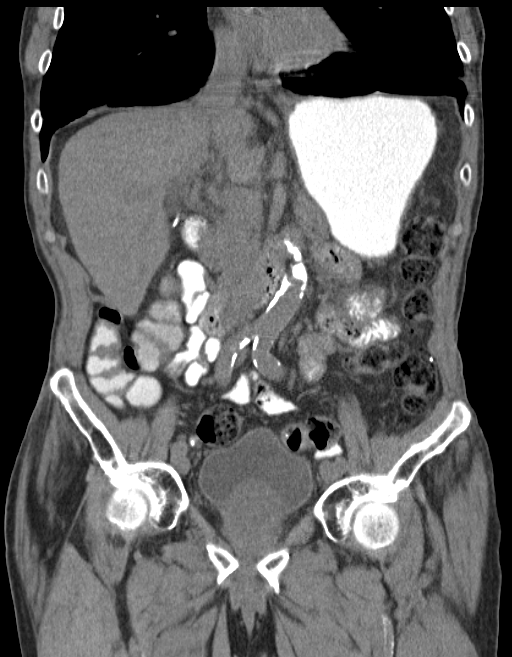
[im 43/78  soft-tissue]
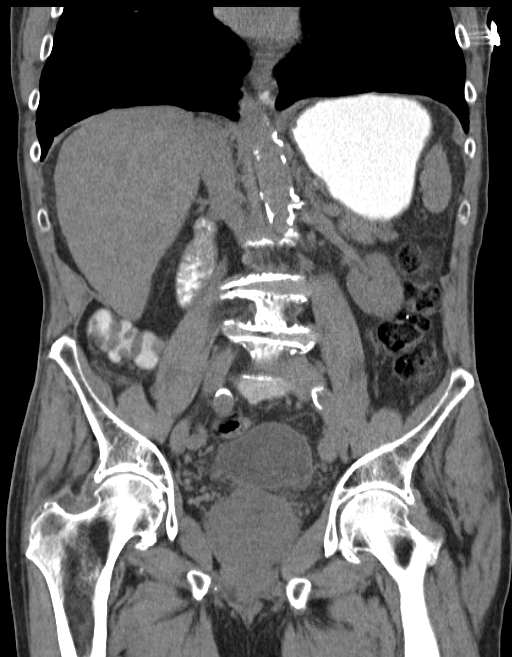

[16 of 46 positions shown; findings below may reference images not displayed]

FINDINGS: There are unremarkable unenhanced appearances of the liver, spleen,
pancreas and adrenals. There is prior cholecystectomy. There is no
bile duct dilatation.

There is a 1.8 cm low-attenuation lesion in the upper right renal
hilum. This is unchanged from the lumbar MRI of 09/25/2010 and
likely is a benign cyst. The kidneys are otherwise remarkable only
for a 2 mm upper pole left collecting system calculus and a 3 mm
midpole right collecting system calculus. Ureters and urinary
bladder appear unremarkable.

The abdominal aorta is normal in caliber with moderate
atherosclerotic calcification.

Bowel and mesentery appear unremarkable.

There is prominent prostatic enlargement with associated elevation
and indentation of the bladder base.

No acute inflammatory changes are evident in the abdomen or pelvis.
There is no adenopathy. There is no ascites.

There is no significant skeletal lesion. Moderately severe
degenerative disc changes are present in the lumbar spine from L3
through S1 and there also is moderate facet arthritis from L4
through S1.

There is no significant abnormality in the lower chest.
IMPRESSION: 1. No acute findings are evident in the abdomen or pelvis.
2. Nephrolithiasis
3. Prostatic enlargement
4. Lumbar degenerative disc and facet changes.

## 2016-12-17 ENCOUNTER — Other Ambulatory Visit: Payer: Self-pay | Admitting: Cardiology

## 2016-12-18 NOTE — Telephone Encounter (Signed)
Rx(s) sent to pharmacy electronically.  

## 2017-05-28 NOTE — Progress Notes (Signed)
HPI: FU CAD/AVR; coronary artery bypass grafting x3 as well as aortic valve replacement with a pericardial tissue valve in May of 2010. Abdominal ultrasound in July of 2013 showed no aneurysm. Last echocardiogram in July 2014 showed normal LV function and a normally functioning prosthetic aortic valve with a mean gradient of 11 mm of mercury; mild RAE/RVE. Carotid dopplers 8/17 showed 60-79 left stenosis and 40-59 right and fu recommended in 12 months. Since I last saw him patient denies dyspnea, chest pain, palpitations or syncope.  Current Outpatient Prescriptions  Medication Sig Dispense Refill  . aspirin 325 MG tablet Take 325 mg by mouth daily.      Marland Kitchen docusate sodium (COLACE) 100 MG capsule Take 100 mg by mouth daily as needed for mild constipation.    . fexofenadine (ALLEGRA) 180 MG tablet Take 180 mg by mouth daily.    . multivitamin-iron-minerals-folic acid (CENTRUM) chewable tablet Chew 1 tablet by mouth daily.    Marland Kitchen NITROSTAT 0.4 MG SL tablet DISSOLVE 1 TAB UNDER TOUNGE FOR CHEST PAIN. MAY REPEAT EVERY 5 MINUTES FOR 3 DOSES. IF NO RELIEF CALL 911 OR GO TO ER 25 tablet 1  . Omega-3 Fatty Acids (FISH OIL) 1200 MG CAPS Take 1,200 mg by mouth daily.     . simvastatin (ZOCOR) 40 MG tablet Take 20 mg by mouth 2 (two) times daily.     No current facility-administered medications for this visit.      Past Medical History:  Diagnosis Date  . Aortic stenosis   . CAD (coronary artery disease)    5/10, myoview: mod inferapical ischemia; 03/07/09 cath 3vd, EF 65%, 30-35 mmHg peak to Ao Valve gradient;  . Essential hypertension, benign   . Hyperlipidemia     Past Surgical History:  Procedure Laterality Date  . AORTIC VALVE REPLACEMENT  03/14/2009   using a 23 mm Edwards pericardial valve  . BACK SURGERY    . CARDIAC CATHETERIZATION  03/07/2009   3vd. EF 65%, 30-35 mmHm peak Ao Valve gradient  . CHOLECYSTECTOMY    . CORONARY ARTERY BYPASS GRAFT  03/14/2009   x3, LIMA - LAD, SVG -  Diag, SVG - RCA  . HERNIA REPAIR     x3    Social History   Social History  . Marital status: Married    Spouse name: N/A  . Number of children: N/A  . Years of education: N/A   Occupational History  . Retired Retired   Social History Main Topics  . Smoking status: Former Games developer  . Smokeless tobacco: Never Used     Comment: Quit in his 20's  . Alcohol use No  . Drug use: Unknown  . Sexual activity: Not on file   Other Topics Concern  . Not on file   Social History Narrative   Married and lives with wife    Family History  Problem Relation Age of Onset  . Heart failure Mother   . Stroke Father   . Heart attack Brother 25  . Hodgkin's lymphoma Brother     ROS: no fevers or chills, productive cough, hemoptysis, dysphasia, odynophagia, melena, hematochezia, dysuria, hematuria, rash, seizure activity, orthopnea, PND, pedal edema, claudication. Remaining systems are negative.  Physical Exam: Well-developed well-nourished in no acute distress.  Skin is warm and dry.  HEENT is normal.  Neck is supple.  Chest is clear to auscultation with normal expansion.  Cardiovascular exam is regular rate and rhythm. 2/6 systolic murmur Abdominal exam nontender  or distended. No masses palpated. Extremities show no edema. neuro grossly intact  ECG- Sinus bradycardia at a rate of 55. No ST changes. personally reviewed  A/P  1 Coronary artery disease-continue aspirin and statin.  2 status post aortic valve replacement-continue SBE prophylaxis.  3 hypertension-blood pressure controlled. Continue present medications.  4 hyperlipidemia-continue statin. Lipids and liver monitored by primary care.   5 carotid artery disease-we will arrange follow-up carotid Dopplers. Continue aspirin and statin.    Olga MillersBrian Crenshaw, MD

## 2017-06-01 ENCOUNTER — Ambulatory Visit (INDEPENDENT_AMBULATORY_CARE_PROVIDER_SITE_OTHER): Payer: Medicare Other | Admitting: Cardiology

## 2017-06-01 ENCOUNTER — Encounter: Payer: Self-pay | Admitting: Cardiology

## 2017-06-01 VITALS — BP 126/78 | HR 55 | Ht 67.0 in | Wt 144.0 lb

## 2017-06-01 DIAGNOSIS — I359 Nonrheumatic aortic valve disorder, unspecified: Secondary | ICD-10-CM | POA: Diagnosis not present

## 2017-06-01 DIAGNOSIS — I679 Cerebrovascular disease, unspecified: Secondary | ICD-10-CM | POA: Diagnosis not present

## 2017-06-01 DIAGNOSIS — I25810 Atherosclerosis of coronary artery bypass graft(s) without angina pectoris: Secondary | ICD-10-CM | POA: Diagnosis not present

## 2017-06-01 DIAGNOSIS — I10 Essential (primary) hypertension: Secondary | ICD-10-CM | POA: Diagnosis not present

## 2017-06-01 DIAGNOSIS — E7800 Pure hypercholesterolemia, unspecified: Secondary | ICD-10-CM

## 2017-06-01 NOTE — Patient Instructions (Signed)
Medication Instructions:   NO CHANGE  Testing/Procedures:  Your physician has requested that you have a carotid duplex. This test is an ultrasound of the carotid arteries in your neck. It looks at blood flow through these arteries that supply the brain with blood. Allow one hour for this exam. There are no restrictions or special instructions.    Follow-Up:  Your physician wants you to follow-up in: ONE YEAR WITH DR CRENSHAW You will receive a reminder letter in the mail two months in advance. If you don't receive a letter, please call our office to schedule the follow-up appointment.   If you need a refill on your cardiac medications before your next appointment, please call your pharmacy.    

## 2017-06-08 ENCOUNTER — Ambulatory Visit (HOSPITAL_COMMUNITY)
Admission: RE | Admit: 2017-06-08 | Discharge: 2017-06-08 | Disposition: A | Discharge status: Home / Self Care | Payer: Medicare Other | Source: Ambulatory Visit | Attending: Internal Medicine | Admitting: Internal Medicine

## 2017-06-08 ENCOUNTER — Encounter (HOSPITAL_COMMUNITY): Payer: Medicare Other

## 2017-06-08 DIAGNOSIS — I679 Cerebrovascular disease, unspecified: Secondary | ICD-10-CM | POA: Insufficient documentation

## 2017-06-08 DIAGNOSIS — I6523 Occlusion and stenosis of bilateral carotid arteries: Secondary | ICD-10-CM

## 2018-05-06 ENCOUNTER — Other Ambulatory Visit: Payer: Self-pay | Admitting: Cardiology

## 2018-05-06 DIAGNOSIS — I6523 Occlusion and stenosis of bilateral carotid arteries: Secondary | ICD-10-CM

## 2018-06-01 NOTE — Progress Notes (Signed)
HPI: FU CAD/AVR; coronary artery bypass grafting x3 as well as aortic valve replacement with a pericardial tissue valve in May of 2010. Abdominal ultrasound in July of 2013 showed no aneurysm. Last echocardiogram in July 2014 showed normal LV function and a normally functioning prosthetic aortic valve with a mean gradient of 11 mm of mercury; mild RAE/RVE. Carotid dopplers 8/18 showed 40-59 right and left. Since I last saw him the patient has dyspnea with more extreme activities but not with routine activities. It is relieved with rest. It is not associated with chest pain. There is no orthopnea, PND. There is no syncope or palpitations. There is no exertional chest pain.   Current Outpatient Medications  Medication Sig Dispense Refill  . aspirin 325 MG tablet Take 325 mg by mouth daily.      . cetirizine (ZYRTEC) 10 MG tablet Take 10 mg by mouth daily.    Marland Kitchen. docusate sodium (COLACE) 100 MG capsule Take 100 mg by mouth daily as needed for mild constipation.    Marland Kitchen. lisinopril-hydrochlorothiazide (PRINZIDE,ZESTORETIC) 10-12.5 MG tablet TAKE ONE (1) TABLET EACH DAY    . multivitamin-iron-minerals-folic acid (CENTRUM) chewable tablet Chew 1 tablet by mouth daily.    Marland Kitchen. NITROSTAT 0.4 MG SL tablet DISSOLVE 1 TAB UNDER TOUNGE FOR CHEST PAIN. MAY REPEAT EVERY 5 MINUTES FOR 3 DOSES. IF NO RELIEF CALL 911 OR GO TO ER 25 tablet 1  . Omega-3 Fatty Acids (FISH OIL) 1200 MG CAPS Take 1,200 mg by mouth daily.     . Probiotic Product (PROBIOTIC PO) Take by mouth.    . simvastatin (ZOCOR) 40 MG tablet Take 20 mg by mouth daily.      No current facility-administered medications for this visit.      Past Medical History:  Diagnosis Date  . Aortic stenosis   . CAD (coronary artery disease)    5/10, myoview: mod inferapical ischemia; 03/07/09 cath 3vd, EF 65%, 30-35 mmHg peak to Ao Valve gradient;  . Essential hypertension, benign   . Hyperlipidemia     Past Surgical History:  Procedure Laterality Date    . AORTIC VALVE REPLACEMENT  03/14/2009   using a 23 mm Edwards pericardial valve  . BACK SURGERY    . CARDIAC CATHETERIZATION  03/07/2009   3vd. EF 65%, 30-35 mmHm peak Ao Valve gradient  . CHOLECYSTECTOMY    . CORONARY ARTERY BYPASS GRAFT  03/14/2009   x3, LIMA - LAD, SVG - Diag, SVG - RCA  . HERNIA REPAIR     x3    Social History   Socioeconomic History  . Marital status: Married    Spouse name: Not on file  . Number of children: Not on file  . Years of education: Not on file  . Highest education level: Not on file  Occupational History  . Occupation: Retired    Associate Professormployer: RETIRED  Social Needs  . Financial resource strain: Not on file  . Food insecurity:    Worry: Not on file    Inability: Not on file  . Transportation needs:    Medical: Not on file    Non-medical: Not on file  Tobacco Use  . Smoking status: Former Games developermoker  . Smokeless tobacco: Never Used  . Tobacco comment: Quit in his 3320's  Substance and Sexual Activity  . Alcohol use: No    Alcohol/week: 0.0 standard drinks  . Drug use: Not on file  . Sexual activity: Not on file  Lifestyle  .  Physical activity:    Days per week: Not on file    Minutes per session: Not on file  . Stress: Not on file  Relationships  . Social connections:    Talks on phone: Not on file    Gets together: Not on file    Attends religious service: Not on file    Active member of club or organization: Not on file    Attends meetings of clubs or organizations: Not on file    Relationship status: Not on file  . Intimate partner violence:    Fear of current or ex partner: Not on file    Emotionally abused: Not on file    Physically abused: Not on file    Forced sexual activity: Not on file  Other Topics Concern  . Not on file  Social History Narrative   Married and lives with wife    Family History  Problem Relation Age of Onset  . Heart failure Mother   . Stroke Father   . Heart attack Brother [id number]  . Hodgkin's  lymphoma Brother     ROS: no fevers or chills, productive cough, hemoptysis, dysphasia, odynophagia, melena, hematochezia, dysuria, hematuria, rash, seizure activity, orthopnea, PND, pedal edema, claudication. Remaining systems are negative.  Physical Exam: Well-developed well-nourished in no acute distress.  Skin is warm and dry.  HEENT is normal.  Neck is supple.  Chest is clear to auscultation with normal expansion.  Cardiovascular exam is regular rate and rhythm.  2/6 systolic murmur left sternal border.  No diastolic murmur. Abdominal exam nontender or distended. No masses palpated. Extremities show 1+ ankle edema. neuro grossly intact  ECG-sinus rhythm with occasional PVC.  No significant ST changes.  Personally reviewed  A/P  1 coronary artery disease-patient doing well with no chest pain.  Plan to continue medical therapy including aspirin (decrease ASA to 81 mg daily) and statin.  2 prior aortic valve replacement-continue SBE prophylaxis.  3 carotid artery disease-plan follow-up carotid Dopplers today.  Continue aspirin and statin.  4 hypertension-blood pressure is controlled.  Continue present medications.   5 hyperlipidemia-continue statin.  Lipids and liver monitored by primary care.  6 lower extremity edema-likely from venous insufficiency.  I have recommended decreased p.o. fluid intake, compression hose and feet elevation.  Olga MillersBrian Crenshaw, MD

## 2018-06-07 ENCOUNTER — Encounter: Payer: Self-pay | Admitting: Cardiology

## 2018-06-07 ENCOUNTER — Ambulatory Visit (INDEPENDENT_AMBULATORY_CARE_PROVIDER_SITE_OTHER): Payer: Medicare Other | Admitting: Cardiology

## 2018-06-07 ENCOUNTER — Ambulatory Visit (HOSPITAL_COMMUNITY)
Admission: RE | Admit: 2018-06-07 | Discharge: 2018-06-07 | Disposition: A | Discharge status: Home / Self Care | Payer: Medicare Other | Source: Ambulatory Visit | Attending: Cardiology | Admitting: Cardiology

## 2018-06-07 VITALS — BP 118/52 | HR 64 | Ht 67.0 in | Wt 139.4 lb

## 2018-06-07 DIAGNOSIS — I679 Cerebrovascular disease, unspecified: Secondary | ICD-10-CM | POA: Diagnosis not present

## 2018-06-07 DIAGNOSIS — I6523 Occlusion and stenosis of bilateral carotid arteries: Secondary | ICD-10-CM

## 2018-06-07 DIAGNOSIS — E7800 Pure hypercholesterolemia, unspecified: Secondary | ICD-10-CM | POA: Diagnosis not present

## 2018-06-07 DIAGNOSIS — I2510 Atherosclerotic heart disease of native coronary artery without angina pectoris: Secondary | ICD-10-CM

## 2018-06-07 DIAGNOSIS — I10 Essential (primary) hypertension: Secondary | ICD-10-CM

## 2018-06-07 DIAGNOSIS — I359 Nonrheumatic aortic valve disorder, unspecified: Secondary | ICD-10-CM

## 2018-06-07 MED ORDER — ASPIRIN 81 MG PO TABS
81.0000 mg | ORAL_TABLET | Freq: Every day | ORAL | Status: DC
Start: 2018-06-07 — End: 2020-11-04

## 2018-06-07 NOTE — Patient Instructions (Signed)
Medication Instructions:   DECREASE ASPIRIN TO 81 MG ONCE DAILY  Follow-Up:  Your physician wants you to follow-up in: ONE YEAR WITH DR Shelda PalRENSHAW You will receive a reminder letter in the mail two months in advance. If you don't receive a letter, please call our office to schedule the follow-up appointment.   USE COMPRESSION HOSE  Fluid Restriction Some health conditions may require you to restrict your fluid intake. This means that you need to limit the amount of fluid you drink each day. When you have a fluid restriction, you must carefully measure and keep track of the amount of fluid you drink. Your health care provider will identify the specific amount of fluid you are allowed each day. This amount may depend on several things, such as:  The amount of urine you produce in a day.  How much fluid you are keeping (retaining) in your body.  Your blood pressure.  What is my plan? Your health care provider recommends that you limit your fluid intake to __1 LITER________ per day. What counts toward my fluid intake? Your fluid intake includes all liquids that you drink, as well as any foods that become liquid at room temperature. The following are examples of some fluids that you will have to restrict:  Tea, coffee, soda, lemonade, milk, water, juice, sport drinks, and nutritional supplement beverages.  Alcoholic beverages.  Cream.  Gravy.  Ice cubes.  Soup and broth.  The following are examples of foods that become liquid at room temperature. These foods will also count toward your fluid intake.  Ice cream and ice milk.  Frozen yogurt and sherbet.  Frozen ice pops.  Flavored gelatin.  How do I keep track of my fluid intake? Each morning, fill a jug with the amount of water that equals the amount of fluid you are allowed for the day. You can use this water as a guideline for fluid allowance. Each time you take in any form of fluid, including ice cubes and foods that become  liquid at room temperature, pour an equal amount of water out of the container. This helps you to see how much fluid you are taking in. It also helps you to see how much of your fluid intake is left for the rest of the day. The following conversions may also be helpful in measuring your fluid intake:  1 cup equals 8 oz (240 mL).   cup equals 6 oz (180 mL).  ? cup equals 5? oz (160 mL).   cup equals 4 oz (120 mL).  ? cup equals 2? oz (80 mL).   cup equals 2 oz (60 mL).  2 Tbsp equals 1 oz (30 mL).  What home care instructions should I follow while restricting fluids?  Make sure that you stay within the recommended limit each day. Always measure and keep track of your fluids, as well as any foods that turn liquid at room temperature.  Use small cups and glasses and learn to sip fluids slowly.  Add a slice of fresh lemon or lemon juice to water or ice. This helps to satisfy your thirst.  Freeze fruit juice or water in an ice cube tray. Use this as part of your fluid allowance. These cubes are useful for quenching your thirst. Measure the amount of liquid in each ice cube prior to freezing so you can subtract this amount from your day's allowance when you consume each frozen cube.  Try frozen fruits between meals, such as grapes or strawberries.  Swallow your pills along with meals or soft foods, such as applesauce or mashed potatoes. This helps you to save your fluid allowance for something that you enjoy.  Weigh yourself every day. Keeping track of your daily weight can help you and your health care provider to notice as soon as possible if you are retaining too much fluid in your body. ? Weigh yourself every morning after you urinate but before you eat breakfast. ? Wear the same amount of clothing each time you weigh yourself. ? Write down your daily weight. Give this weight record to your health care provider. If your weight is going up, you may be retaining too much fluid. Every  2 cups (480 mL) of fluid retained in the body becomes an extra 1 lb (0.45 kg) of body weight.  Avoid salty foods. These foods make you thirsty and make fluid control more difficult.  Brush your teeth often or rinse your mouth with mouthwash to help your dry mouth. Lemon wedges, hard sour candies, chewing gum, or breath spray may also help to moisten your mouth.  Keep the temperature in your home at a cooler level. Dry air increases thirst, so keep the air in your home as humid as possible.  Avoid being out in the hot sun, which can cause you to sweat and become thirsty. What are some signs that I may be taking in too much fluid? You may be taking in too much fluid if:  Your weight increases. Contact your health care provider if your weight increases 3 lb or more in a day or if it increases 5 lb or more in a week.  Your face, hands, legs, feet, and belly (abdomen) start to swell.  You have trouble breathing.  This information is not intended to replace advice given to you by your health care provider. Make sure you discuss any questions you have with your health care provider. Document Released: 07/27/2007 Document Revised: 03/06/2016 Document Reviewed: 02/28/2014 Elsevier Interactive Patient Education  [employer].

## 2018-06-08 ENCOUNTER — Other Ambulatory Visit: Payer: Self-pay | Admitting: *Deleted

## 2018-06-08 DIAGNOSIS — I679 Cerebrovascular disease, unspecified: Secondary | ICD-10-CM

## 2019-01-19 ENCOUNTER — Emergency Department (HOSPITAL_COMMUNITY): Payer: Medicare Other

## 2019-01-19 ENCOUNTER — Inpatient Hospital Stay (HOSPITAL_COMMUNITY)
Admission: EM | Admit: 2019-01-19 | Discharge: 2019-01-21 | DRG: 193 | Disposition: A | Discharge status: Home / Self Care | Payer: Medicare Other | Attending: Internal Medicine | Admitting: Internal Medicine

## 2019-01-19 ENCOUNTER — Encounter (HOSPITAL_COMMUNITY): Payer: Self-pay | Admitting: Emergency Medicine

## 2019-01-19 ENCOUNTER — Other Ambulatory Visit: Payer: Self-pay

## 2019-01-19 DIAGNOSIS — R7989 Other specified abnormal findings of blood chemistry: Secondary | ICD-10-CM | POA: Diagnosis present

## 2019-01-19 DIAGNOSIS — N179 Acute kidney failure, unspecified: Secondary | ICD-10-CM | POA: Diagnosis present

## 2019-01-19 DIAGNOSIS — H9190 Unspecified hearing loss, unspecified ear: Secondary | ICD-10-CM | POA: Diagnosis present

## 2019-01-19 DIAGNOSIS — Z953 Presence of xenogenic heart valve: Secondary | ICD-10-CM

## 2019-01-19 DIAGNOSIS — Z8249 Family history of ischemic heart disease and other diseases of the circulatory system: Secondary | ICD-10-CM | POA: Diagnosis not present

## 2019-01-19 DIAGNOSIS — Z9049 Acquired absence of other specified parts of digestive tract: Secondary | ICD-10-CM | POA: Diagnosis not present

## 2019-01-19 DIAGNOSIS — Z79899 Other long term (current) drug therapy: Secondary | ICD-10-CM

## 2019-01-19 DIAGNOSIS — E785 Hyperlipidemia, unspecified: Secondary | ICD-10-CM | POA: Diagnosis present

## 2019-01-19 DIAGNOSIS — Z7982 Long term (current) use of aspirin: Secondary | ICD-10-CM | POA: Diagnosis not present

## 2019-01-19 DIAGNOSIS — Z823 Family history of stroke: Secondary | ICD-10-CM | POA: Diagnosis not present

## 2019-01-19 DIAGNOSIS — Z807 Family history of other malignant neoplasms of lymphoid, hematopoietic and related tissues: Secondary | ICD-10-CM

## 2019-01-19 DIAGNOSIS — J189 Pneumonia, unspecified organism: Principal | ICD-10-CM | POA: Diagnosis present

## 2019-01-19 DIAGNOSIS — Z87891 Personal history of nicotine dependence: Secondary | ICD-10-CM

## 2019-01-19 DIAGNOSIS — I10 Essential (primary) hypertension: Secondary | ICD-10-CM | POA: Diagnosis not present

## 2019-01-19 DIAGNOSIS — F0390 Unspecified dementia without behavioral disturbance: Secondary | ICD-10-CM | POA: Diagnosis present

## 2019-01-19 DIAGNOSIS — R4182 Altered mental status, unspecified: Secondary | ICD-10-CM

## 2019-01-19 DIAGNOSIS — R4781 Slurred speech: Secondary | ICD-10-CM | POA: Diagnosis present

## 2019-01-19 DIAGNOSIS — G9341 Metabolic encephalopathy: Secondary | ICD-10-CM | POA: Diagnosis present

## 2019-01-19 DIAGNOSIS — E860 Dehydration: Secondary | ICD-10-CM | POA: Diagnosis present

## 2019-01-19 DIAGNOSIS — I959 Hypotension, unspecified: Secondary | ICD-10-CM | POA: Diagnosis present

## 2019-01-19 DIAGNOSIS — R531 Weakness: Secondary | ICD-10-CM | POA: Diagnosis not present

## 2019-01-19 DIAGNOSIS — Z882 Allergy status to sulfonamides status: Secondary | ICD-10-CM

## 2019-01-19 DIAGNOSIS — Z951 Presence of aortocoronary bypass graft: Secondary | ICD-10-CM | POA: Diagnosis not present

## 2019-01-19 DIAGNOSIS — I110 Hypertensive heart disease with heart failure: Secondary | ICD-10-CM | POA: Diagnosis present

## 2019-01-19 DIAGNOSIS — I5032 Chronic diastolic (congestive) heart failure: Secondary | ICD-10-CM | POA: Diagnosis present

## 2019-01-19 DIAGNOSIS — I2510 Atherosclerotic heart disease of native coronary artery without angina pectoris: Secondary | ICD-10-CM | POA: Diagnosis present

## 2019-01-19 HISTORY — DX: Chronic diastolic (congestive) heart failure: I50.32

## 2019-01-19 LAB — CBC WITH DIFFERENTIAL/PLATELET
Abs Immature Granulocytes: 0.05 10*3/uL (ref 0.00–0.07)
Basophils Absolute: 0 10*3/uL (ref 0.0–0.1)
Basophils Relative: 0 %
Eosinophils Absolute: 0.2 10*3/uL (ref 0.0–0.5)
Eosinophils Relative: 3 %
HCT: 35.4 % — ABNORMAL LOW (ref 39.0–52.0)
Hemoglobin: 12.1 g/dL — ABNORMAL LOW (ref 13.0–17.0)
Immature Granulocytes: 1 %
Lymphocytes Relative: 15 %
Lymphs Abs: 0.9 10*3/uL (ref 0.7–4.0)
MCH: 36.1 pg — ABNORMAL HIGH (ref 26.0–34.0)
MCHC: 34.2 g/dL (ref 30.0–36.0)
MCV: 105.7 fL — ABNORMAL HIGH (ref 80.0–100.0)
Monocytes Absolute: 0.4 10*3/uL (ref 0.1–1.0)
Monocytes Relative: 8 %
Neutro Abs: 4.2 10*3/uL (ref 1.7–7.7)
Neutrophils Relative %: 73 %
Platelets: 107 10*3/uL — ABNORMAL LOW (ref 150–400)
RBC: 3.35 MIL/uL — ABNORMAL LOW (ref 4.22–5.81)
RDW: 13.1 % (ref 11.5–15.5)
WBC: 5.7 10*3/uL (ref 4.0–10.5)
nRBC: 0 % (ref 0.0–0.2)

## 2019-01-19 LAB — COMPREHENSIVE METABOLIC PANEL
ALT: 30 U/L (ref 0–44)
AST: 43 U/L — ABNORMAL HIGH (ref 15–41)
Albumin: 3.6 g/dL (ref 3.5–5.0)
Alkaline Phosphatase: 73 U/L (ref 38–126)
Anion gap: 8 (ref 5–15)
BUN: 40 mg/dL — ABNORMAL HIGH (ref 8–23)
CO2: 23 mmol/L (ref 22–32)
Calcium: 8.1 mg/dL — ABNORMAL LOW (ref 8.9–10.3)
Chloride: 104 mmol/L (ref 98–111)
Creatinine, Ser: 1.66 mg/dL — ABNORMAL HIGH (ref 0.61–1.24)
GFR calc Af Amer: 42 mL/min — ABNORMAL LOW (ref >60)
GFR calc non Af Amer: 36 mL/min — ABNORMAL LOW (ref >60)
Glucose, Bld: 114 mg/dL — ABNORMAL HIGH (ref 70–99)
Potassium: 3.9 mmol/L (ref 3.5–5.1)
Sodium: 135 mmol/L (ref 135–145)
Total Bilirubin: 0.9 mg/dL (ref 0.3–1.2)
Total Protein: 6 g/dL — ABNORMAL LOW (ref 6.5–8.1)

## 2019-01-19 LAB — URINALYSIS, ROUTINE W REFLEX MICROSCOPIC
Bacteria, UA: NONE SEEN
Bilirubin Urine: NEGATIVE
Glucose, UA: NEGATIVE mg/dL
Ketones, ur: NEGATIVE mg/dL
Leukocytes,Ua: NEGATIVE
Nitrite: NEGATIVE
Protein, ur: NEGATIVE mg/dL
Specific Gravity, Urine: 1.019 (ref 1.005–1.030)
pH: 5 (ref 5.0–8.0)

## 2019-01-19 LAB — TROPONIN I: Troponin I: <0.03 ng/mL (ref <0.03)

## 2019-01-19 LAB — LACTIC ACID, PLASMA: Lactic Acid, Venous: 1 mmol/L (ref 0.5–1.9)

## 2019-01-19 MED ORDER — SODIUM CHLORIDE 0.9 % IV BOLUS
500.0000 mL | Freq: Once | INTRAVENOUS | Status: AC
  Administered 2019-01-19: 19:00:00 500 mL via INTRAVENOUS

## 2019-01-19 MED ORDER — ONDANSETRON HCL 4 MG/2ML IJ SOLN: 4.0000 mg | Freq: Four times a day (QID) | INTRAMUSCULAR | Status: DC | PRN

## 2019-01-19 MED ORDER — SODIUM CHLORIDE 0.9 % IV SOLN
500.0000 mg | INTRAVENOUS | Status: DC
  Administered 2019-01-20 – 2019-01-21 (×2): 500 mg via INTRAVENOUS
  Filled 2019-01-19 (×2): qty 500

## 2019-01-19 MED ORDER — SODIUM CHLORIDE 0.9 % IV SOLN
2.0000 g | INTRAVENOUS | Status: DC
  Administered 2019-01-20: 2 g via INTRAVENOUS
  Filled 2019-01-19: qty 20

## 2019-01-19 MED ORDER — SODIUM CHLORIDE 0.9 % IV BOLUS
500.0000 mL | Freq: Once | INTRAVENOUS | Status: AC
  Administered 2019-01-19: 21:00:00 500 mL via INTRAVENOUS

## 2019-01-19 MED ORDER — ACETAMINOPHEN 325 MG PO TABS: 650.0000 mg | ORAL_TABLET | Freq: Four times a day (QID) | ORAL | Status: DC | PRN

## 2019-01-19 MED ORDER — ACETAMINOPHEN 650 MG RE SUPP: 650.0000 mg | Freq: Four times a day (QID) | RECTAL | Status: DC | PRN

## 2019-01-19 MED ORDER — SODIUM CHLORIDE 0.9 % IV SOLN
2.0000 g | Freq: Once | INTRAVENOUS | Status: AC
  Administered 2019-01-19: 21:00:00 2 g via INTRAVENOUS
  Filled 2019-01-19: qty 2

## 2019-01-19 MED ORDER — SODIUM CHLORIDE 0.9 % IV SOLN
INTRAVENOUS | Status: AC
  Administered 2019-01-20 (×3): via INTRAVENOUS

## 2019-01-19 MED ORDER — ONDANSETRON HCL 4 MG PO TABS: 4.0000 mg | ORAL_TABLET | Freq: Four times a day (QID) | ORAL | Status: DC | PRN

## 2019-01-19 MED ORDER — VANCOMYCIN HCL IN DEXTROSE 1-5 GM/200ML-% IV SOLN
1000.0000 mg | Freq: Once | INTRAVENOUS | Status: AC
  Administered 2019-01-19: 22:00:00 1000 mg via INTRAVENOUS
  Filled 2019-01-19: qty 200

## 2019-01-19 MED ORDER — HEPARIN SODIUM (PORCINE) 5000 UNIT/ML IJ SOLN
5000.0000 [IU] | Freq: Three times a day (TID) | INTRAMUSCULAR | Status: DC
  Administered 2019-01-20 – 2019-01-21 (×3): 5000 [IU] via SUBCUTANEOUS
  Filled 2019-01-19 (×3): qty 1

## 2019-01-19 NOTE — ED Provider Notes (Signed)
[facility] EMERGENCY DEPARTMENT Provider Note   CSN: [id number] Arrival date & time: 01/19/19  1839    History   Chief Complaint Chief Complaint  Patient presents with  . Altered Mental Status    HPI Terry Barrett is a 85 y.o. male.     HPI Patient is a very poor historian.  Presents by EMS for altered mental status.  Patient is coming from home.  Family states that increased confusion and generalized weakness began yesterday.  Denies pain, fever, shortness of breath or cough.  No known recent trauma. Past Medical History:  Diagnosis Date  . Aortic stenosis   . CAD (coronary artery disease)    5/10, myoview: mod inferapical ischemia; 03/07/09 cath 3vd, EF 65%, 30-35 mmHg peak to Ao Valve gradient;  . Essential hypertension, benign   . Hyperlipidemia     Patient Active Problem List   Diagnosis Date Noted  . Cerebrovascular disease 03/16/2014  . Bruit 04/02/2012  . BRADYCARDIA 09/03/2009  . HYPERLIPIDEMIA 03/30/2009  . Coronary atherosclerosis 03/30/2009  . ESSENTIAL HYPERTENSION, BENIGN 03/07/2009  . ANGINA PECTORIS 03/07/2009  . Aortic valve disorder 03/07/2009    Past Surgical History:  Procedure Laterality Date  . AORTIC VALVE REPLACEMENT  03/14/2009   using a 23 mm Edwards pericardial valve  . BACK SURGERY    . CARDIAC CATHETERIZATION  03/07/2009   3vd. EF 65%, 30-35 mmHm peak Ao Valve gradient  . CHOLECYSTECTOMY    . CORONARY ARTERY BYPASS GRAFT  03/14/2009   x3, LIMA - LAD, SVG - Diag, SVG - RCA  . HERNIA REPAIR     x3        Home Medications    Prior to Admission medications   Medication Sig Start Date End Date Taking? Authorizing Provider  aspirin 81 MG tablet Take 1 tablet (81 mg total) by mouth daily. 06/07/18   Lewayne Bunting, MD  cetirizine (ZYRTEC) 10 MG tablet Take 10 mg by mouth daily.    [provider]  docusate sodium (COLACE) 100 MG capsule Take 100 mg by mouth daily as needed for mild constipation.    [provider]  lisinopril-hydrochlorothiazide (PRINZIDE,ZESTORETIC) 10-12.5 MG tablet TAKE ONE (1) TABLET EACH DAY 01/05/18   [provider]  multivitamin-iron-minerals-folic acid (CENTRUM) chewable tablet Chew 1 tablet by mouth daily.    [provider]  NITROSTAT 0.4 MG SL tablet DISSOLVE 1 TAB UNDER TOUNGE FOR CHEST PAIN. MAY REPEAT EVERY 5 MINUTES FOR 3 DOSES. IF NO RELIEF CALL 911 OR GO TO ER 12/18/16   Lewayne Bunting, MD  Omega-3 Fatty Acids (FISH OIL) 1200 MG CAPS Take 1,200 mg by mouth daily.     [provider]  Probiotic Product (PROBIOTIC PO) Take by mouth.    [provider]  simvastatin (ZOCOR) 40 MG tablet Take 20 mg by mouth daily.     [provider]    Family History Family History  Problem Relation Age of Onset  . Heart failure Mother   . Stroke Father   . Heart attack Brother 44  . Hodgkin's lymphoma Brother     Social History Social History   Tobacco Use  . Smoking status: Former Games developer  . Smokeless tobacco: Never Used  . Tobacco comment: Quit in his 20's  Substance Use Topics  . Alcohol use: No    Alcohol/week: 0.0 standard drinks  . Drug use: Not on file     Allergies   Sulfamethoxazole-trimethoprim   Review  of Systems Review of Systems  Unable to perform ROS: Mental status change     Physical Exam Updated Vital Signs BP (!) 96/45   Pulse 75   Temp 99.3 F (37.4 C) (Oral)   Resp (!) 25   Ht 5\' 8"  (1.727 m)   Wt 65.8 kg   SpO2 97%   BMI 22.05 kg/m   Physical Exam Vitals signs and nursing note reviewed.  Constitutional:      General: He is not in acute distress.    Appearance: Normal appearance. He is well-developed. He is not ill-appearing.  HENT:     Head: Normocephalic and atraumatic.     Comments: No obvious head trauma.  No intraoral trauma.  Cranial nerves II through XII grossly intact.    Mouth/Throat:     Mouth: Mucous membranes are moist.  Eyes:     Extraocular Movements:  Extraocular movements intact.     Pupils: Pupils are equal, round, and reactive to light.  Neck:     Musculoskeletal: Normal range of motion and neck supple. No neck rigidity or muscular tenderness.     Comments: No meningismus.  No posterior midline cervical tenderness to palpation. Cardiovascular:     Rate and Rhythm: Normal rate and regular rhythm.  Pulmonary:     Effort: Pulmonary effort is normal. No respiratory distress.     Breath sounds: Normal breath sounds. No wheezing or rales.  Abdominal:     General: Bowel sounds are normal. There is no distension.     Palpations: Abdomen is soft. There is no mass.     Tenderness: There is no abdominal tenderness. There is no right CVA tenderness, left CVA tenderness, guarding or rebound.     Hernia: No hernia is present.  Musculoskeletal: Normal range of motion.        General: No swelling, tenderness, deformity or signs of injury.     Right lower leg: No edema.     Left lower leg: No edema.     Comments: No midline thoracic or lumbar tenderness.  Pelvis is stable.  No lower extremity swelling, asymmetry or tenderness.  Lymphadenopathy:     Cervical: No cervical adenopathy.  Skin:    General: Skin is warm and dry.     Findings: No erythema or rash.  Neurological:     Mental Status: He is alert.     Comments: Oriented to self.  5/5 motor in all extremities.  Sensation to light touch intact  Psychiatric:        Behavior: Behavior normal.      ED Treatments / Results  Labs (all labs ordered are listed, but only abnormal results are displayed) Labs Reviewed  CBC WITH DIFFERENTIAL/PLATELET - Abnormal; Notable for the following components:      Result Value   RBC 3.35 (*)    Hemoglobin 12.1 (*)    HCT 35.4 (*)    MCV 105.7 (*)    MCH 36.1 (*)    Platelets 107 (*)    All other components within normal limits  COMPREHENSIVE METABOLIC PANEL - Abnormal; Notable for the following components:   Glucose, Bld 114 (*)    BUN 40 (*)     Creatinine, Ser 1.66 (*)    Calcium 8.1 (*)    Total Protein 6.0 (*)    AST 43 (*)    GFR calc non Af Amer 36 (*)    GFR calc Af Amer 42 (*)    All other components within  normal limits  URINALYSIS, ROUTINE W REFLEX MICROSCOPIC - Abnormal; Notable for the following components:   APPearance HAZY (*)    Hgb urine dipstick SMALL (*)    All other components within normal limits  CULTURE, BLOOD (ROUTINE X 2)  CULTURE, BLOOD (ROUTINE X 2)  LACTIC ACID, PLASMA  TROPONIN I    EKG None  Radiology Dg Chest Port 1 View  Result Date: 01/19/2019 CLINICAL DATA:  Altered mental status. Weakness. EXAM: PORTABLE CHEST 1 VIEW COMPARISON:  Radiograph 03/22/2015 FINDINGS: Low lung volumes. Post median sternotomy. Unchanged heart size and mediastinal contours with aortic atherosclerosis and tortuosity. Ill-defined bibasilar opacities. No pulmonary edema. No pneumothorax or large pleural effusion. Chronic degenerative change of both shoulders, right greater than left. IMPRESSION: 1. Low lung volumes with patchy bibasilar opacities favoring atelectasis. 2. Unchanged aortic tortuosity. Aortic Atherosclerosis (ICD10-I70.0). Electronically Signed   By: Narda RutherfordMelanie  Sanford M.D.   On: 01/19/2019 19:39    Procedures Procedures (including critical care time)  Medications Ordered in ED Medications  vancomycin (VANCOCIN) IVPB 1000 mg/200 mL premix (has no administration in time range)  ceFEPIme (MAXIPIME) 2 g in sodium chloride 0.9 % 100 mL IVPB (2 g Intravenous New Bag/Given 01/19/19 2129)  sodium chloride 0.9 % bolus 500 mL (0 mLs Intravenous Stopped 01/19/19 2019)  sodium chloride 0.9 % bolus 500 mL (500 mLs Intravenous New Bag/Given 01/19/19 2127)     Initial Impression / Assessment and Plan / ED Course  I have reviewed the triage vital signs and the nursing notes.  Pertinent labs & imaging results that were available during my care of the patient were reviewed by me and considered in my medical decision making  (see chart for details).       Oren BinetJimmie F Sans was evaluated in Emergency Department on 01/19/2019 for the symptoms described in the history of present illness. He was evaluated in the context of the global COVID-19 pandemic, which necessitated consideration that the patient might be at risk for infection with the SARS-CoV-2 virus that causes COVID-19. Institutional protocols and algorithms that pertain to the evaluation of patients at risk for COVID-19 are in a state of rapid change based on information released by regulatory bodies including the CDC and federal and state organizations. These policies and algorithms were followed during the patient's care in the ED.  Initially mildly hypotensive but improved with IV fluids.  Patient has bilateral infiltrates on chest x-ray.  Concern for developing pneumonia.  Normal lactic acid.  Given broad-spectrum antibiotics.  Discussed with hospitalist will see patient emergency department and admit.  Patient with no known COVID exposure though history is limited.  Final Clinical Impressions(s) / ED Diagnoses   Final diagnoses:  Altered mental status, unspecified altered mental status type  Community acquired pneumonia, unspecified laterality    ED Discharge Orders    None       Loren RacerYelverton, Ankur Snowdon, MD 01/19/19 2144

## 2019-01-19 NOTE — Progress Notes (Signed)
85 y.o. male who is being admitted for acute encephalopathy, believed to be metabolic in etiology on the basis of suspected bibasilar pneumonia.   Full H&P to follow.     Newton Pigg, DO Hospitalist

## 2019-01-19 NOTE — ED Triage Notes (Signed)
Pt from home. Family reported increase in confusion and generalized weakness that began yesterday. Denies any slurred speech or unilateral weakness. Family denies cough, SOB, or fever. Pt alert and oriented to self.

## 2019-01-19 NOTE — H&P (Signed)
History and Physical    PLEASE NOTE THAT DRAGON DICTATION SOFTWARE WAS USED IN THE CONSTRUCTION OF THIS NOTE.   Terry Barrett BJY:[id number] DOB: 1927-12-18 DOA: 01/19/2019  PCP: Theodoro Kos, MD Patient coming from: Home  I have personally briefly reviewed patient's old medical records in [facility]  Chief Complaint: Confusion  HPI: Terry Barrett is a 85 y.o. male with medical history significant for coronary artery disease status post three-vessel CABG in 2010, aortic stenosis status post bioprosthetic aortic valve replacement, chronic diastolic heart failure, hypertension, who is admitted to [facility] on 01/19/2019 with acute metabolic encephalopathy in the setting of suspected bibasilar pneumonia after presenting from home to the [facility] ED for evaluation of confusion.   In the setting of the patient's acute encephalopathy, the following history is obtained via my discussions with the emergency department physician and via chart review.   The patient lives at home and his family reportedly noted him to be demonstrating evidence of progressive confusion relative to baseline mental status over the last day, prompting them to contact EMS who subsequently brought patient to [facility] ED for further evaluation of such.  Patient's family also noted the patient to demonstrate generalized weakness over that same timeframe in the absence of any associated acute focal neurologic deficits, including no evidence of acute focal weakness.  Patient's family reported that the patient has not exhibited any cough, shortness of breath, or fever.  They also report that the patient has had no suspected or confirmed COVID-19 exposures. Beyond COVID-19, they also deny that the patient has had recent exposure to individuals experiencing acute respiratory symptoms.    ED Course: Vital signs in the emergency department were notable for the following: Temperature max 99.3, with most recent temperature noted  to be 98.1; heart rate 71-85; initial blood pressure 95/47, which increased to 134/99 following interval IV fluids, as further described below; respiratory rate 20-27, and oxygen saturation 94 to 100% on room air.  Labs performed in the ED were notable for the following: CMP notable for sodium 135, bicarbonate 23, anion gap 8, BUN 40, creatinine 1.66 relative to most recent prior creatinine data point of 1.6 when checked four years ago in June 2016, glucose 114.  CBC notable for white blood cell count of 5700 with 73% neutrophils.  Lactic acid 1.0.  Troponin I x1-.  Urinalysis notable for small amount of hemoglobin, but no evidence of red blood cells, no white blood cells, no bacteria, nitrate negative, leukocyte esterace negative, and positive for hyaline casts.  Blood cultures x2 were collected prior to initiation of any antibiotics.  Chest x-ray, per final radiology report, showed evidence of bibasilar airspace opacities without evidence of edema or effusion.  While still in the emergency department, the following were administered: Cefepime 2 g IV x1, vancomycin 1 g IV x1, and 500 cc IV normal saline boluses x2.  Subsequently, the patient was admitted to the MedSurg floor for further evaluation and management of acute metabolic encephalopathy basis suspected bibasilar pneumonia.    Review of Systems: As per HPI otherwise 10 point review of systems negative.   Past Medical History:  Diagnosis Date  . Aortic stenosis   . CAD (coronary artery disease)    5/10, myoview: mod inferapical ischemia; 03/07/09 cath 3vd, EF 65%, 30-35 mmHg peak to Ao Valve gradient;  . Chronic diastolic heart failure (HCC)   . Essential hypertension, benign   . Hyperlipidemia     Past Surgical History:  Procedure Laterality Date  . AORTIC VALVE REPLACEMENT  03/14/2009   using a 23 mm Edwards pericardial valve  . BACK SURGERY    . CARDIAC CATHETERIZATION  03/07/2009   3vd. EF 65%, 30-35 mmHm peak Ao Valve gradient   . CHOLECYSTECTOMY    . CORONARY ARTERY BYPASS GRAFT  03/14/2009   x3, LIMA - LAD, SVG - Diag, SVG - RCA  . HERNIA REPAIR     x3    Social History:  reports that he has quit smoking. He has never used smokeless tobacco. He reports that he does not drink alcohol. No history on file for drug.   Allergies  Allergen Reactions  . Sulfamethoxazole-Trimethoprim Itching    Family History  Problem Relation Age of Onset  . Heart failure Mother   . Stroke Father   . Heart attack Brother 13  . Hodgkin's lymphoma Brother      Prior to Admission medications   Medication Sig Start Date End Date Taking? Authorizing Provider  aspirin 81 MG tablet Take 1 tablet (81 mg total) by mouth daily. 06/07/18   Lewayne Bunting, MD  cetirizine (ZYRTEC) 10 MG tablet Take 10 mg by mouth daily.    [provider]  docusate sodium (COLACE) 100 MG capsule Take 100 mg by mouth daily as needed for mild constipation.    [provider]  lisinopril-hydrochlorothiazide (PRINZIDE,ZESTORETIC) 10-12.5 MG tablet TAKE ONE (1) TABLET EACH DAY 01/05/18   [provider]  multivitamin-iron-minerals-folic acid (CENTRUM) chewable tablet Chew 1 tablet by mouth daily.    [provider]  NITROSTAT 0.4 MG SL tablet DISSOLVE 1 TAB UNDER TOUNGE FOR CHEST PAIN. MAY REPEAT EVERY 5 MINUTES FOR 3 DOSES. IF NO RELIEF CALL 911 OR GO TO ER 12/18/16   Lewayne Bunting, MD  Omega-3 Fatty Acids (FISH OIL) 1200 MG CAPS Take 1,200 mg by mouth daily.     [provider]  Probiotic Product (PROBIOTIC PO) Take by mouth.    [provider]  simvastatin (ZOCOR) 40 MG tablet Take 20 mg by mouth daily.     [provider]      Objective    Physical Exam: Vitals:   01/19/19 1900 01/19/19 1930 01/19/19 2000 01/19/19 2030  BP: (!) 95/47 (!) 108/53 (!) 109/51 (!) 96/45  Pulse: 76 71 81 75  Resp: (!) 29 (!) 27 (!) 22 (!) 25  Temp:      TempSrc:      SpO2: 94% 97% 97% 97%  Weight:       Height:        General: appears to be stated age; alert, but confused (patient unsure as to where he is, his date of birth, the current year, or the current [place] president).  Skin: warm, dry, no rash Head:  AT/[place] Mouth:  Oral mucosa membranes appear dry, normal dentition Neck: supple; trachea midline Heart:  RRR; 2/6 systolic murmur.  Lungs: CTAB, did not appreciate any wheezes, rales, or rhonchi Abdomen: + BS; soft, ND, NT Extremities: trace edema in b/l LE's; no muscle wasting Neuro: In the setting of patient's presenting acute encephalopathy and associated inability to follow instructions, unable to perform full neurologic assessment at this time, including unable to completely assess strength, sensation, or cranial nerves.  However, patient noted to be spontaneously moving all 4 extremities.   Labs on Admission: I have personally reviewed following labs and imaging studies  CBC: Recent Labs  Lab 01/19/19 1938  WBC 5.7  NEUTROABS  4.2  HGB 12.1*  HCT 35.4*  MCV 105.7*  PLT 107*   Basic Metabolic Panel: Recent Labs  Lab 01/19/19 1938  NA 135  K 3.9  CL 104  CO2 23  GLUCOSE 114*  BUN 40*  CREATININE 1.66*  CALCIUM 8.1*   GFR: Estimated Creatinine Clearance: 28.6 mL/min (A) (by C-G formula based on SCr of 1.66 mg/dL (H)). Liver Function Tests: Recent Labs  Lab 01/19/19 1938  AST 43*  ALT 30  ALKPHOS 73  BILITOT 0.9  PROT 6.0*  ALBUMIN 3.6   No results for input(s): LIPASE, AMYLASE in the last 168 hours. No results for input(s): AMMONIA in the last 168 hours. Coagulation Profile: No results for input(s): INR, PROTIME in the last 168 hours. Cardiac Enzymes: Recent Labs  Lab 01/19/19 1938  TROPONINI <0.03   BNP (last 3 results) No results for input(s): PROBNP in the last 8760 hours. HbA1C: No results for input(s): HGBA1C in the last 72 hours. CBG: No results for input(s): GLUCAP in the last 168 hours. Lipid Profile: No results for input(s): CHOL,  HDL, LDLCALC, TRIG, CHOLHDL, LDLDIRECT in the last 72 hours. Thyroid Function Tests: No results for input(s): TSH, T4TOTAL, FREET4, T3FREE, THYROIDAB in the last 72 hours. Anemia Panel: No results for input(s): VITAMINB12, FOLATE, FERRITIN, TIBC, IRON, RETICCTPCT in the last 72 hours. Urine analysis:    Component Value Date/Time   COLORURINE YELLOW 01/19/2019 1950   APPEARANCEUR HAZY (A) 01/19/2019 1950   LABSPEC 1.019 01/19/2019 1950   PHURINE 5.0 01/19/2019 1950   GLUCOSEU NEGATIVE 01/19/2019 1950   HGBUR SMALL (A) 01/19/2019 1950   BILIRUBINUR NEGATIVE 01/19/2019 1950   KETONESUR NEGATIVE 01/19/2019 1950   PROTEINUR NEGATIVE 01/19/2019 1950   UROBILINOGEN 1.0 03/22/2015 1903   NITRITE NEGATIVE 01/19/2019 1950   LEUKOCYTESUR NEGATIVE 01/19/2019 1950    Radiological Exams on Admission: Dg Chest Port 1 View  Result Date: 01/19/2019 CLINICAL DATA:  Altered mental status. Weakness. EXAM: PORTABLE CHEST 1 VIEW COMPARISON:  Radiograph 03/22/2015 FINDINGS: Low lung volumes. Post median sternotomy. Unchanged heart size and mediastinal contours with aortic atherosclerosis and tortuosity. Ill-defined bibasilar opacities. No pulmonary edema. No pneumothorax or large pleural effusion. Chronic degenerative change of both shoulders, right greater than left. IMPRESSION: 1. Low lung volumes with patchy bibasilar opacities favoring atelectasis. 2. Unchanged aortic tortuosity. Aortic Atherosclerosis (ICD10-I70.0). Electronically Signed   By: Narda RutherfordMelanie  Sanford M.D.   On: 01/19/2019 19:39     Assessment/Plan   Terry Barrett is a 85 y.o. male with medical history significant for coronary artery disease status post three-vessel CABG in 2010, aortic stenosis status post bioprosthetic aortic valve replacement, chronic diastolic heart failure, hypertension, who is admitted to [facility] on 01/19/2019 with acute metabolic encephalopathy in the setting of suspected bibasilar pneumonia after presenting  from home to the [facility] ED for evaluation of confusion.    Principal Problem:   Acute metabolic encephalopathy Active Problems:   Essential hypertension, benign   CAP (community acquired pneumonia)   Elevated serum creatinine   Generalized weakness   Dehydration   Prerenal azotemia   Chronic diastolic CHF (congestive heart failure) (HCC)    #) Acute metabolic encephalopathy: Per family, patient has demonstrated evidence of progressive confusion over the course of the last day in the absence of any acute focal neurologic deficits.  Suspect that this is on the basis of metabolic etiology stemming from suspected community-acquired pneumonia, as further described below.  Per my encounter with the patient, he  is alert, but confused, as further qualified above, and unable to follow instructions at this time.  Of note, urinalysis is not suggestive of an underlying urinary tract infection, although I will check CPK level given finding of hemoglobin in the urine in the absence of any RBCs.  Differential also includes CVA, however this is felt to be less likely as the patient has spontaneously moving all 4 extremities.  Suspect an additional contribution from dehydration, is further described low  Plan: Work-up and management of suspected bibasilar pneumonia, as further described below.  Check TSH, urinary drug screen, and CPK level.  Repeat BMP in the morning.  I have placed a nursing communication order requesting bedside swallow evaluation prior to initiation of diet or oral medications.  Will attempt to avoid central acting medications.      #) Community-acquired pneumonia: Diagnosis on the basis of presenting progressive confusion over the last day associated with mild tachypnea, and chest x-ray demonstrating evidence of bibasilar airspace opacities concerning for infiltrates.  Aside from tachypnea, the patient possesses no additional SIRS criteria at this time, and therefore is not considered septic  at present.  Additionally, presenting lactic acid found to be nonelevated at 1.0.  In the absence of any hospitalizations over the last 3 months, presentation meets criteria to be considered community-acquired in nature.  As described above, no suspected or confirmed COVID-19 exposures.  In the emergency department tonight, the patient has received cefepime and vancomycin.  Given the community acquired nature of patient's pneumonia, the absence of sepsis, and his current hemodynamic stability, I will scale back a bit on the antibiotics, and initiate Rocephin and a azithromycin for coverage of CAP.   Plan: Rocephin and azithromycin, as above.  IV NS at 100 cc/h.  Will monitor for results of blood cultures x2 collected in the ED this evening prior to initiation of any antibiotics.  Repeat CBC with differential in the morning.  Check MRSA PCR.  We will also check strep pneumonia urine antigen.  Droplet precautions.    #) Elevated creatinine: Today's presenting labs reflect creatinine of 1.66.  Evaluation of this value is somewhat complicated by the presence of only 2 prior data points over the last 8 years.  Specifically most recent prior creatinine data point occurred in June 2016 at which time it was found to be 1.6.  Prior to that, creatinine was found to be 0.9 in June 2012.  It is possible that the 2 most recent creatinine data points represent acute elevations vs development of chronic kidney disease at some point following June 2012.  Regardless, at the time of this evening's presentation, the patient appears clinically dry with dry oral mucosa membranes as well as finding of prerenal azotemia with BUN to creatinine ratio 24.  As described above, urinalysis this evening was notable for small amount of hemoglobin in the absence of any RBCs, prompting evaluation of CPK level.  Additionally, denies urinalysis was positive for hyaline casts, finding is consistent with suspected dehydration.  Plan: Check  random urine sodium as well as random urine creatinine.  Check CPK.  IV normal saline at 100 cc/h.  Monitor strict I's and O's and attempt to avoid nephrotoxic agents.  Repeat BMP in the morning.  Will hold home lisinopril and HCTZ for now.    #) Generalized weakness: The patient's family reported that the patient has exhibited generalized weakness over the course of the last day in the absence of any associated acute focal neurologic deficits.  Suspect that this is on the basis of bibasilar pneumonia compounded by dehydration, as above.   Plan: IV fluids, as above, as well as work-up and management of suspected bibasilar pneumonia, as further described above.  Check TSH.  I also placed a consult for physical therapy to evaluate the patient in the morning.     #) Chronic diastolic heart failure: Most recent echocardiogram appears to have occurred in 2014, and showed LVEF 60 to 65%, no evidence of focal wall motion normalities, grade 1 diastolic dysfunction, and a bioprosthetic aortic valve that appeared to be functioning normally at that time.  No evidence of acutely decompensated heart failure at this time.  Per review of outpatient medications, it does not appear that the patient is on any chronic diuretic medications, aside from HCTZ.  No evidence of acutely decompensated heart failure at this time.  Plan: Monitor strict I's and O's.  Repeat BMP in the morning.  Will monitor for evidence of development of volume overload, particularly the setting of plan for gentle IV fluids overnight, as above.      #) Essential hypertension: Outpatient antihypertensive regimen consists of lisinopril and HCTZ.  Presenting blood pressure found to be mildly low at 95/47, which is improved to 134/99 following interval IV fluids, suggestive of an element of dehydration.   Plan: In the setting of borderline low initial blood pressures as well as presenting suspected infectious process, will hold home  antihypertensives for now.     #) Hyperlipidemia: On simvastatin as an outpatient.  Plan: We will continue home simvastatin, although this would be reevaluated in the event that aforementioned CPK was found to be elevated.      DVT prophylaxis: Heparin 5000 units [place] TID. Code Status: full (presumed) Family Communication: (none) Disposition Plan:  Per Rounding Team Consults called: (none)  Admission status: inpatient; med-surg.    PLEASE NOTE THAT DRAGON DICTATION SOFTWARE WAS USED IN THE CONSTRUCTION OF THIS NOTE.   Angie Fava DO [facility] Pager [phone] From 3PM- 11PM.   Otherwise, please contact night-coverage  [email or web address] Password [facility]  01/19/2019, 9:41 PM

## 2019-01-20 DIAGNOSIS — R7989 Other specified abnormal findings of blood chemistry: Secondary | ICD-10-CM | POA: Diagnosis present

## 2019-01-20 DIAGNOSIS — E860 Dehydration: Secondary | ICD-10-CM | POA: Diagnosis present

## 2019-01-20 DIAGNOSIS — J189 Pneumonia, unspecified organism: Secondary | ICD-10-CM | POA: Diagnosis present

## 2019-01-20 DIAGNOSIS — R531 Weakness: Secondary | ICD-10-CM

## 2019-01-20 DIAGNOSIS — I5032 Chronic diastolic (congestive) heart failure: Secondary | ICD-10-CM | POA: Diagnosis present

## 2019-01-20 LAB — BLOOD CULTURE ID PANEL (REFLEXED)

## 2019-01-20 LAB — TSH: TSH: 1.638 u[IU]/mL (ref 0.350–4.500)

## 2019-01-20 LAB — COMPREHENSIVE METABOLIC PANEL
ALT: 30 U/L (ref 0–44)
AST: 42 U/L — ABNORMAL HIGH (ref 15–41)
Albumin: 3.3 g/dL — ABNORMAL LOW (ref 3.5–5.0)
Alkaline Phosphatase: 70 U/L (ref 38–126)
Anion gap: 10 (ref 5–15)
BUN: 33 mg/dL — ABNORMAL HIGH (ref 8–23)
CO2: 25 mmol/L (ref 22–32)
Calcium: 8.3 mg/dL — ABNORMAL LOW (ref 8.9–10.3)
Chloride: 104 mmol/L (ref 98–111)
Creatinine, Ser: 1.32 mg/dL — ABNORMAL HIGH (ref 0.61–1.24)
GFR calc Af Amer: 55 mL/min — ABNORMAL LOW (ref >60)
GFR calc non Af Amer: 48 mL/min — ABNORMAL LOW (ref >60)
Glucose, Bld: 105 mg/dL — ABNORMAL HIGH (ref 70–99)
Potassium: 3.8 mmol/L (ref 3.5–5.1)
Sodium: 139 mmol/L (ref 135–145)
Total Bilirubin: 1 mg/dL (ref 0.3–1.2)
Total Protein: 5.7 g/dL — ABNORMAL LOW (ref 6.5–8.1)

## 2019-01-20 LAB — CBC WITH DIFFERENTIAL/PLATELET
Abs Immature Granulocytes: 0.03 10*3/uL (ref 0.00–0.07)
Basophils Absolute: 0 10*3/uL (ref 0.0–0.1)
Basophils Relative: 1 %
Eosinophils Absolute: 0.2 10*3/uL (ref 0.0–0.5)
Eosinophils Relative: 3 %
HCT: 37.3 % — ABNORMAL LOW (ref 39.0–52.0)
Hemoglobin: 12.4 g/dL — ABNORMAL LOW (ref 13.0–17.0)
Immature Granulocytes: 1 %
Lymphocytes Relative: 22 %
Lymphs Abs: 1.2 10*3/uL (ref 0.7–4.0)
MCH: 36 pg — ABNORMAL HIGH (ref 26.0–34.0)
MCHC: 33.2 g/dL (ref 30.0–36.0)
MCV: 108.4 fL — ABNORMAL HIGH (ref 80.0–100.0)
Monocytes Absolute: 0.3 10*3/uL (ref 0.1–1.0)
Monocytes Relative: 6 %
Neutro Abs: 3.9 10*3/uL (ref 1.7–7.7)
Neutrophils Relative %: 67 %
Platelets: 97 10*3/uL — ABNORMAL LOW (ref 150–400)
RBC: 3.44 MIL/uL — ABNORMAL LOW (ref 4.22–5.81)
RDW: 13.2 % (ref 11.5–15.5)
WBC: 5.7 10*3/uL (ref 4.0–10.5)
nRBC: 0.9 % — ABNORMAL HIGH (ref 0.0–0.2)

## 2019-01-20 LAB — RAPID URINE DRUG SCREEN, HOSP PERFORMED
Amphetamines: NOT DETECTED
Barbiturates: NOT DETECTED
Benzodiazepines: NOT DETECTED
Cocaine: NOT DETECTED
Opiates: NOT DETECTED
Tetrahydrocannabinol: NOT DETECTED

## 2019-01-20 LAB — MAGNESIUM: Magnesium: 1.7 mg/dL (ref 1.7–2.4)

## 2019-01-20 LAB — SODIUM, URINE, RANDOM: Sodium, Ur: 129 mmol/L

## 2019-01-20 LAB — MRSA PCR SCREENING: MRSA by PCR: NEGATIVE

## 2019-01-20 LAB — STREP PNEUMONIAE URINARY ANTIGEN: Strep Pneumo Urinary Antigen: NEGATIVE

## 2019-01-20 LAB — CREATININE, URINE, RANDOM: Creatinine, Urine: 101.73 mg/dL

## 2019-01-20 LAB — CK: Total CK: 127 U/L (ref 49–397)

## 2019-01-20 MED ORDER — ADULT MULTIVITAMIN LIQUID CH
15.0000 mL | Freq: Every day | ORAL | Status: DC
  Administered 2019-01-20 – 2019-01-21 (×2): 15 mL via ORAL
  Filled 2019-01-20 (×2): qty 15

## 2019-01-20 MED ORDER — CENTRUM PO CHEW
1.0000 | CHEWABLE_TABLET | Freq: Every day | ORAL | Status: DC
  Filled 2019-01-20 (×2): qty 1

## 2019-01-20 MED ORDER — SIMVASTATIN 20 MG PO TABS
20.0000 mg | ORAL_TABLET | Freq: Every day | ORAL | Status: DC
  Administered 2019-01-20: 18:00:00 20 mg via ORAL
  Filled 2019-01-20: qty 1

## 2019-01-20 MED ORDER — ENSURE ENLIVE PO LIQD
237.0000 mL | Freq: Two times a day (BID) | ORAL | Status: DC
  Administered 2019-01-20 – 2019-01-21 (×2): 237 mL via ORAL

## 2019-01-20 MED ORDER — ASPIRIN EC 81 MG PO TBEC
81.0000 mg | DELAYED_RELEASE_TABLET | Freq: Every day | ORAL | Status: DC
  Administered 2019-01-20 – 2019-01-21 (×2): 81 mg via ORAL
  Filled 2019-01-20 (×2): qty 1

## 2019-01-20 MED ORDER — LORATADINE 10 MG PO TABS
10.0000 mg | ORAL_TABLET | Freq: Every day | ORAL | Status: DC
  Administered 2019-01-20 – 2019-01-21 (×2): 10 mg via ORAL
  Filled 2019-01-20 (×2): qty 1

## 2019-01-20 NOTE — Progress Notes (Signed)
CRITICAL VALUE ALERT  Critical Value:  BCID positive for staph species  Date & Time Notied:  1930 01/20/2019  Provider Notified: Bruna Potter  Orders Received/Actions taken:

## 2019-01-20 NOTE — Evaluation (Signed)
Physical Therapy Evaluation Patient Details Name: Terry Barrett MRN: [id number] DOB: 1927/11/03 Today's Date: 01/20/2019   History of Present Illness  Terry Barrett is a 85 y.o. male with medical history significant for coronary artery disease status post three-vessel CABG in 2010, aortic stenosis status post bioprosthetic aortic valve replacement, chronic diastolic heart failure, hypertension, who is admitted to [facility] on 01/19/2019 with acute metabolic encephalopathy in the setting of suspected bibasilar pneumonia after presenting from home to the [facility] ED for evaluation of confusion.     Clinical Impression  Patient functioning at baseline for functional mobility and gait.  Plan:  Patient discharged from physical therapy to care of nursing for ambulation daily as tolerated for length of stay.     Follow Up Recommendations No PT follow up    Equipment Recommendations  None recommended by PT    Recommendations for Other Services       Precautions / Restrictions Precautions Precautions: None Restrictions Weight Bearing Restrictions: No      Mobility  Bed Mobility Overal bed mobility: Independent                Transfers Overall transfer level: Independent                  Ambulation/Gait Ambulation/Gait assistance: Modified independent (Device/Increase time) Gait Distance (Feet): 150 Feet   Gait Pattern/deviations: WFL(Within Functional Limits) Gait velocity: slightly decreased   General Gait Details: demonstrates good return for ambulation on level, inclined, and declined surfaces without loss of balance  Stairs            Wheelchair Mobility    Modified Rankin (Stroke Patients Only)       Balance Overall balance assessment: No apparent balance deficits (not formally assessed)                                           Pertinent Vitals/Pain Pain Assessment: No/denies pain    Home Living Family/patient  expects to be discharged to:: Private residence Living Arrangements: Spouse/significant other Available Help at Discharge: Family Type of Home: House Home Access: Stairs to enter Entrance Stairs-Rails: Left;Right;Can reach both Secretary/administrator of Steps: 3 Home Layout: Laundry or work area in basement;Able to live on main level with bedroom/bathroom Home Equipment: Other (comment);Bedside commode Additional Comments: has walking stick    Prior Function Level of Independence: Independent         Comments: Environmental manager        Extremity/Trunk Assessment   Upper Extremity Assessment Upper Extremity Assessment: Overall WFL for tasks assessed    Lower Extremity Assessment Lower Extremity Assessment: Overall WFL for tasks assessed    Cervical / Trunk Assessment Cervical / Trunk Assessment: Normal  Communication   Communication: No difficulties  Cognition Arousal/Alertness: Awake/alert Behavior During Therapy: WFL for tasks assessed/performed Overall Cognitive Status: Within Functional Limits for tasks assessed                                        General Comments      Exercises     Assessment/Plan    PT Assessment Patent does not need any further PT services  PT Problem List         PT  Treatment Interventions      PT Goals (Current goals can be found in the Care Plan section)  Acute Rehab PT Goals Patient Stated Goal: return home PT Goal Formulation: With patient Time For Goal Achievement: 01/20/19 Potential to Achieve Goals: Good    Frequency     Barriers to discharge        Co-evaluation               AM-PAC PT "6 Clicks" Mobility  Outcome Measure Help needed turning from your back to your side while in a flat bed without using bedrails?: None Help needed moving from lying on your back to sitting on the side of a flat bed without using bedrails?: None Help needed moving to and from  a bed to a chair (including a wheelchair)?: None Help needed standing up from a chair using your arms (e.g., wheelchair or bedside chair)?: None Help needed to walk in hospital room?: None Help needed climbing 3-5 steps with a railing? : None 6 Click Score: 24    End of Session   Activity Tolerance: Patient tolerated treatment well;Patient limited by fatigue Patient left: in chair;with call bell/phone within reach Nurse Communication: Mobility status PT Visit Diagnosis: Unsteadiness on feet (R26.81);Other abnormalities of gait and mobility (R26.89);Muscle weakness (generalized) (M62.81)    Time: [id number] PT Time Calculation (min) (ACUTE ONLY): 16 min   Charges:   PT Evaluation $PT Eval Low Complexity: 1 Low PT Treatments $Gait Training: 8-22 mins        10:56 AM, 01/20/19 Ocie BobJames Kinley Ferrentino, MPT Physical Therapist with [facility] 336 [phone] office [phone] mobile phone

## 2019-01-20 NOTE — Progress Notes (Signed)
PROGRESS NOTE    Terry BinetJimmie F Ziemba  WUJ:[id number] DOB: [birth year] DOA: 01/19/2019 PCP: Theodoro KosLewis, William B, MD   Brief Narrative:  Per HPI: Terry Barrett is a 85 y.o. male with medical history significant for coronary artery disease status post three-vessel CABG in 2010, aortic stenosis status post bioprosthetic aortic valve replacement, chronic diastolic heart failure, hypertension, who is admitted to [facility] on 01/19/2019 with acute metabolic encephalopathy in the setting of suspected bibasilar pneumonia after presenting from home to the [facility] ED for evaluation of confusion.   In the setting of the patient's acute encephalopathy, the following history is obtained via my discussions with the emergency department physician and via chart review.   The patient lives at home and his family reportedly noted him to be demonstrating evidence of progressive confusion relative to baseline mental status over the last day, prompting them to contact EMS who subsequently brought patient to [facility] ED for further evaluation of such.  Patient's family also noted the patient to demonstrate generalized weakness over that same timeframe in the absence of any associated acute focal neurologic deficits, including no evidence of acute focal weakness.  Patient's family reported that the patient has not exhibited any cough, shortness of breath, or fever.  They also report that the patient has had no suspected or confirmed COVID-19 exposures. Beyond COVID-19, they also deny that the patient has had recent exposure to individuals experiencing acute respiratory symptoms.   Patient was admitted with acute metabolic encephalopathy likely related to bibasilar pneumonia for which he is currently on Rocephin and azithromycin.  Assessment & Plan:   Principal Problem:   Acute metabolic encephalopathy Active Problems:   Essential hypertension, benign   CAP (community acquired pneumonia)   Elevated serum creatinine  Generalized weakness   Dehydration   Prerenal azotemia   Chronic diastolic CHF (congestive heart failure) (HCC)   1. Acute metabolic encephalopathy in the setting of memory impairment and possible dementia secondary to community-acquired pneumonia.  TSH and urinary drug screen as well as CPK levels within normal limits.  Spoke with spouse who states that patient does not carry a diagnosis of dementia, however he is noted to have some memory issues.  Maintain on Rocephin and azithromycin with urine strep pneumonia antigen pending.  Blood cultures with no growth noted. 2. Mild AKI on suspected CKD.  Creatinine levels have improved with normal saline which I will continue for now.  With hold nephrotoxic agents.  Fluid balance is only +240 mL. 3. Generalized weakness secondary to above.  Appreciate PT evaluation.  Patient does ambulate with rolling walker at baseline according to spouse. 4. Chronic diastolic heart failure.  Monitor strict I's and O's and daily weights and continue gentle, time-limited IV fluid. 5. Essential hypertension-with soft blood pressure readings.  Withhold blood pressure agents at this time and maintain on IV fluid. 6. Dyslipidemia.  Continue simvastatin.   DVT prophylaxis: Heparin Code Status: Full Family Communication: Spoke with spouse on phone Disposition Plan: Continue current antibiotics and monitor cultures.  Continue IV fluid.  PT evaluation pending.  Anticipate discharge in the next 24 to 48 hours if stable to go home.   Consultants:   None  Procedures:   None  Antimicrobials:   Rocephin and azithromycin 4/8->   Subjective: Patient seen and evaluated today with no new acute complaints or concerns. No acute concerns or events noted overnight.  Patient is very hard of hearing and appears to be confused at baseline.  He denies  any significant complaints or concerns.  Objective: Vitals:   01/19/19 2230 01/19/19 2300 01/19/19 2345 01/20/19 0530  BP: (!)  134/99 96/69 119/67 (!) 100/55  Pulse: 85 78 77 72  Resp: (!) 25 18  20   Temp:   98.1 F (36.7 C) 98.8 F (37.1 C)  TempSrc:   Oral Oral  SpO2: 98% 100% 95% 96%  Weight:   63.8 kg 64.2 kg  Height:        Intake/Output Summary (Last 24 hours) at 01/20/2019 0950 Last data filed at 01/20/2019 0900 Gross per 24 hour  Intake 540 ml  Output -  Net 540 ml   Filed Weights   01/19/19 1844 01/19/19 2345 01/20/19 0530  Weight: 65.8 kg 63.8 kg 64.2 kg    Examination:  General exam: Appears calm and comfortable  Respiratory system: Clear to auscultation. Respiratory effort normal. Cardiovascular system: S1 & S2 heard, RRR. No JVD, murmurs, rubs, gallops or clicks. No pedal edema. Gastrointestinal system: Abdomen is nondistended, soft and nontender. No organomegaly or masses felt. Normal bowel sounds heard. Central nervous system: Alert and oriented. No focal neurological deficits. Extremities: Symmetric 5 x 5 power. Skin: No rashes, lesions or ulcers Psychiatry: Judgement and insight appear normal. Mood & affect appropriate.     Data Reviewed: I have personally reviewed following labs and imaging studies  CBC: Recent Labs  Lab 01/19/19 1938 01/20/19 0004  WBC 5.7 5.7  NEUTROABS 4.2 3.9  HGB 12.1* 12.4*  HCT 35.4* 37.3*  MCV 105.7* 108.4*  PLT 107* 97*   Basic Metabolic Panel: Recent Labs  Lab 01/19/19 1938 01/20/19 0510  NA 135 139  K 3.9 3.8  CL 104 104  CO2 23 25  GLUCOSE 114* 105*  BUN 40* 33*  CREATININE 1.66* 1.32*  CALCIUM 8.1* 8.3*  MG  --  1.7   GFR: Estimated Creatinine Clearance: 35.1 mL/min (A) (by C-G formula based on SCr of 1.32 mg/dL (H)). Liver Function Tests: Recent Labs  Lab 01/19/19 1938 01/20/19 0510  AST 43* 42*  ALT 30 30  ALKPHOS 73 70  BILITOT 0.9 1.0  PROT 6.0* 5.7*  ALBUMIN 3.6 3.3*   No results for input(s): LIPASE, AMYLASE in the last 168 hours. No results for input(s): AMMONIA in the last 168 hours. Coagulation Profile: No  results for input(s): INR, PROTIME in the last 168 hours. Cardiac Enzymes: Recent Labs  Lab 01/19/19 1938 01/19/19 1947  CKTOTAL  --  127  TROPONINI <0.03  --    BNP (last 3 results) No results for input(s): PROBNP in the last 8760 hours. HbA1C: No results for input(s): HGBA1C in the last 72 hours. CBG: No results for input(s): GLUCAP in the last 168 hours. Lipid Profile: No results for input(s): CHOL, HDL, LDLCALC, TRIG, CHOLHDL, LDLDIRECT in the last 72 hours. Thyroid Function Tests: Recent Labs    01/19/19 1947  TSH 1.638   Anemia Panel: No results for input(s): VITAMINB12, FOLATE, FERRITIN, TIBC, IRON, RETICCTPCT in the last 72 hours. Sepsis Labs: Recent Labs  Lab 01/19/19 1938  LATICACIDVEN 1.0    Recent Results (from the past 240 hour(s))  Culture, blood (Routine X 2) w Reflex to ID Panel     Status: None (Preliminary result)   Collection Time: 01/19/19  7:38 PM  Result Value Ref Range Status   Specimen Description BLOOD RIGHT ARM  Final   Special Requests   Final    BOTTLES DRAWN AEROBIC AND ANAEROBIC Blood Culture adequate volume  Culture   Final    NO GROWTH < 12 HOURS Performed at [facility], [address] [place], [place] [address]    Report Status PENDING  Incomplete  Culture, blood (Routine X 2) w Reflex to ID Panel     Status: None (Preliminary result)   Collection Time: 01/19/19  7:47 PM  Result Value Ref Range Status   Specimen Description BLOOD RIGHT HAND  Final   Special Requests   Final    BOTTLES DRAWN AEROBIC ONLY Blood Culture adequate volume   Culture   Final    NO GROWTH < 12 HOURS Performed at [facility], [address], [place], [place] [address]    Report Status PENDING  Incomplete  MRSA PCR Screening     Status: None   Collection Time: 01/20/19  3:35 AM  Result Value Ref Range Status   MRSA by PCR NEGATIVE NEGATIVE Final    Comment:        The GeneXpert MRSA Assay (FDA approved for NASAL specimens only), is one component  of a comprehensive MRSA colonization surveillance program. It is not intended to diagnose MRSA infection nor to guide or monitor treatment for MRSA infections. Performed at [facility], [address], [place], [place] [address]          Radiology Studies: [facility] 1 View  Result Date: 01/19/2019 CLINICAL DATA:  Altered mental status. Weakness. EXAM: PORTABLE CHEST 1 VIEW COMPARISON:  Radiograph 03/22/2015 FINDINGS: Low lung volumes. Post median sternotomy. Unchanged heart size and mediastinal contours with aortic atherosclerosis and tortuosity. Ill-defined bibasilar opacities. No pulmonary edema. No pneumothorax or large pleural effusion. Chronic degenerative change of both shoulders, right greater than left. IMPRESSION: 1. Low lung volumes with patchy bibasilar opacities favoring atelectasis. 2. Unchanged aortic tortuosity. Aortic Atherosclerosis (ICD10-I70.0). Electronically Signed   By: Narda Rutherford M.D.   On: 01/19/2019 19:39        Scheduled Meds: . aspirin EC  81 mg Oral Daily  . heparin  5,000 Units Subcutaneous Q8H  . loratadine  10 mg Oral Daily  . multivitamin  15 mL Oral Daily  . simvastatin  20 mg Oral q1800   Continuous Infusions: . sodium chloride 100 mL/hr at 01/20/19 0815  . azithromycin 500 mg (01/20/19 0309)  . cefTRIAXone (ROCEPHIN)  IV       LOS: 1 day    Time spent: 30 minutes    Caraline Deutschman Hoover Brunette, DO [facility] Pager [phone]  If 7PM-7AM, please contact night-coverage [email or web address] Password [email or web address] 01/20/2019, 9:50 AM

## 2019-01-20 NOTE — Progress Notes (Signed)
Initial Nutrition Assessment  DOCUMENTATION CODES:      INTERVENTION:  Regular diet   Ensure Enlive po BID, each supplement provides 350 kcal and 20 grams of protein   NUTRITION DIAGNOSIS:   Increased nutrient needs related to acute illness(acute metabolic encephalopathy and pneumonia.) as evidenced by estimated needs.  GOAL:   Patient will meet greater than or equal to 90% of their needs  MONITOR:   PO intake, Labs, I & O's, Weight trends  REASON FOR ASSESSMENT:   Malnutrition Screening Tool    ASSESSMENT: Patient is an 85 yo male with history of CAD, HTN, CHF.  He presents from home to ED with altered mental status -acute metabolic encephalopathy and pneumonia. Patient dehydrated on admission.  Unable to reach patient in room. Talked with nursing. The patient remains pleasantly confused. He is able to feed himself and ate approximately 50% of breakfast and lunch.  Patient is on daily weight monitoring and strict I/O's. History shows stable wt maintenance. For most of the past 4 years patient has ranged between 60-65 kg.  Bilateral lower extremity edema is a limiting factor in assessment of actual percentage wt change.  Medications reviewed and include: MVI, Rocephin  I/O's: +240 ml    Labs: BUN and Cr improved from yesterday. No known history of CKD. BMP Latest Ref Rng & Units 01/20/2019 01/19/2019 03/22/2015  Glucose 70 - 99 mg/dL 025(K) 270(W) 237(S)  BUN 8 - 23 mg/dL 28(B) 15(V) 76(H)  Creatinine 0.61 - 1.24 mg/dL 6.07(P) 7.10(G) 2.69(S)  Sodium 135 - 145 mmol/L 139 135 129(L)  Potassium 3.5 - 5.1 mmol/L 3.8 3.9 3.9  Chloride 98 - 111 mmol/L 104 104 92(L)  CO2 22 - 32 mmol/L 25 23 26   Calcium 8.9 - 10.3 mg/dL 8.3(L) 8.1(L) 8.4(L)     NUTRITION - FOCUSED PHYSICAL EXAM:  Unable to perform   Diet Order:   Diet Order            Diet regular Room service appropriate? Yes; Fluid consistency: Thin  Diet effective now              EDUCATION NEEDS:  Not  appropriate for education at this time   Skin:  Skin Assessment: Reviewed RN Assessment(dry, intact- non-tenting skin turgor)  Last BM:  unknown  Height:   Ht Readings from Last 1 Encounters:  01/19/19 5\' 8"  (1.727 m)    Weight:   Wt Readings from Last 1 Encounters:  01/20/19 64.2 kg    Ideal Body Weight:  70 kg  BMI:  Body mass index is 21.52 kg/m.  Estimated Nutritional Needs:   Kcal:  1792-1920 (28-30 kcal/kg/bw)  Protein:  75-80 gr (~1.2-1.3 gr/kg/bw)   Fluid:  per MD goals- MD monitoring wt daily and strict I/O's   Royann Shivers MS,RD,CSG,LDN Office: [phone] Pager: [phone]

## 2019-01-20 NOTE — Plan of Care (Signed)
PT REMAINS PLEASANTLY CONFUSED, ORIENTED TO PERSON ONLY, ATTEMPTS AT REORIENTING UNSUCCESSFUL.

## 2019-01-21 LAB — BASIC METABOLIC PANEL
Anion gap: 6 (ref 5–15)
BUN: 25 mg/dL — ABNORMAL HIGH (ref 8–23)
CO2: 24 mmol/L (ref 22–32)
Calcium: 8.1 mg/dL — ABNORMAL LOW (ref 8.9–10.3)
Chloride: 109 mmol/L (ref 98–111)
Creatinine, Ser: 0.93 mg/dL (ref 0.61–1.24)
GFR calc Af Amer: >60 mL/min (ref >60)
GFR calc non Af Amer: >60 mL/min (ref >60)
Glucose, Bld: 92 mg/dL (ref 70–99)
Potassium: 3.5 mmol/L (ref 3.5–5.1)
Sodium: 139 mmol/L (ref 135–145)

## 2019-01-21 LAB — CBC
HCT: 35.4 % — ABNORMAL LOW (ref 39.0–52.0)
Hemoglobin: 12.1 g/dL — ABNORMAL LOW (ref 13.0–17.0)
MCH: 35.5 pg — ABNORMAL HIGH (ref 26.0–34.0)
MCHC: 34.2 g/dL (ref 30.0–36.0)
MCV: 103.8 fL — ABNORMAL HIGH (ref 80.0–100.0)
Platelets: 88 10*3/uL — ABNORMAL LOW (ref 150–400)
RBC: 3.41 MIL/uL — ABNORMAL LOW (ref 4.22–5.81)
RDW: 12.9 % (ref 11.5–15.5)
WBC: 5.2 10*3/uL (ref 4.0–10.5)
nRBC: 0 % (ref 0.0–0.2)

## 2019-01-21 MED ORDER — DM-GUAIFENESIN ER 30-600 MG PO TB12: 1.0000 | ORAL_TABLET | Freq: Two times a day (BID) | ORAL | 0 refills | Status: AC

## 2019-01-21 MED ORDER — AZITHROMYCIN 500 MG PO TABS: 500.0000 mg | ORAL_TABLET | Freq: Every day | ORAL | 0 refills | Status: DC

## 2019-01-21 MED ORDER — CEFDINIR 300 MG PO CAPS: 300.0000 mg | ORAL_CAPSULE | Freq: Two times a day (BID) | ORAL | 0 refills | Status: DC

## 2019-01-21 MED ORDER — ENSURE ENLIVE PO LIQD: 237.0000 mL | Freq: Two times a day (BID) | ORAL | 12 refills | Status: AC

## 2019-01-21 NOTE — Discharge Summary (Signed)
Physician Discharge Summary  Terry BinetJimmie F Mantz ZOX:[id number] DOB: 1928-04-04 DOA: 01/19/2019  PCP: Theodoro KosLewis, Terry B, MD  Admit date: 01/19/2019  Discharge date: 01/21/2019  Admitted From:Home  Disposition:  Home  Recommendations for Outpatient Follow-up:  1. Follow up with PCP in 1-2 weeks and consider restarting medication for dementia 2. Complete course of antibiotics as prescribed  Home Health: None  Equipment/Devices: None  Discharge Condition: Stable  CODE STATUS: Full  Diet recommendation: Heart Healthy  Brief/Interim Summary: Per HPI: 69Jimmie F [name] a 85 y.o.malewith medical history significant forcoronary artery disease status post three-vessel CABGin2010, aortic stenosis status post bioprosthetic aortic valve replacement, chronic diastolic heart failure, hypertension, [facility] in the setting of suspected bibasilar pneumonia after [facility] to [facility] ED[facility] confusion.  In the setting of the patient's acute encephalopathy, the following history is obtained via my discussions with the emergency department physician and via chart review.  The patientlives at home and hisfamily reportedly noted him to be demonstrating evidence of progressive confusion relative to baseline mental status over the last day,prompting them to contact EMS who subsequently [facility] ED[facility] such. Patient's family also noted the patient to demonstrate generalized weakness over that same timeframe in the absence of any associated acute focal neurologic deficits, including no evidence of acute focal weakness. Patient's family reported that the patient has not exhibited any cough, shortness of breath, or fever.They also report that the patient has had no suspected or confirmed COVID-19exposures. Beyond COVID-19, they also deny that the patient has had  recentexposure to individuals experiencing acute respiratory symptoms.  Patient was admitted with acute metabolic encephalopathy likely related to bibasilar pneumonia and he was treated with azithromycin and Rocephin for 2 days.  He has done quite well and has no further slurring of his speech or movement disorders as was described by family members on admission.  He continues to remain pleasantly confused, but this is his baseline according to his daughter.  PT has evaluated patient and he is safe for discharge back to home at this point in time.  He has clinically improved with no other acute events noted during this brief course of admission.  He will follow-up with his PCP in the next 1 to 2 weeks to address his dementia.  Discharge Diagnoses:  Principal Problem:   Acute metabolic encephalopathy Active Problems:   Essential hypertension, benign   CAP (community acquired pneumonia)   Elevated serum creatinine   Generalized weakness   Dehydration   Prerenal azotemia   Chronic diastolic CHF (congestive heart failure) (HCC)  Principal discharge diagnosis: Acute metabolic encephalopathy secondary to bibasilar pneumonia in the setting of dementia.  Discharge Instructions  Discharge Instructions    Diet - low sodium heart healthy   Complete by:  As directed    Increase activity slowly   Complete by:  As directed      Allergies as of 01/21/2019      Reactions   Sulfamethoxazole-trimethoprim Itching      Medication List    TAKE these medications   aspirin 81 MG tablet Take 1 tablet (81 mg total) by mouth daily.   azithromycin 500 MG tablet Commonly known as:  Zithromax Take 1 tablet (500 mg total) by mouth daily for 3 days. Take 1 tablet daily for 3 days.   cefdinir 300 MG capsule Commonly known as:  OMNICEF Take 1 capsule (300 mg total) by mouth  2 (two) times daily for 5 days.   cetirizine 10 MG tablet Commonly known as:  ZYRTEC Take 10 mg by mouth daily.    dextromethorphan-guaiFENesin 30-600 MG 12hr tablet Commonly known as:  MUCINEX DM Take 1 tablet by mouth 2 (two) times daily for 5 days.   docusate sodium 100 MG capsule Commonly known as:  COLACE Take 100 mg by mouth daily as needed for mild constipation.   feeding supplement (ENSURE ENLIVE) Liqd Take 237 mLs by mouth 2 (two) times daily between meals.   Fish Oil 1200 MG Caps Take 1,200 mg by mouth daily.   lisinopril-hydrochlorothiazide 10-12.5 MG tablet Commonly known as:  PRINZIDE,ZESTORETIC TAKE ONE (1) TABLET EACH DAY   multivitamin-iron-minerals-folic acid chewable tablet Chew 1 tablet by mouth daily.   Nitrostat 0.4 MG SL tablet Generic drug:  nitroGLYCERIN DISSOLVE 1 TAB UNDER TOUNGE FOR CHEST PAIN. MAY REPEAT EVERY 5 MINUTES FOR 3 DOSES. IF NO RELIEF CALL 911 OR GO TO ER   PROBIOTIC PO Take by mouth.   simvastatin 40 MG tablet Commonly known as:  ZOCOR Take 20 mg by mouth daily.      Follow-up Information    Theodoro Kos, MD Follow up in 1 week(s).   Specialty:  Internal Medicine Contact information: [address] [place] [address] [phone]          Allergies  Allergen Reactions  . Sulfamethoxazole-Trimethoprim Itching    Consultations:  None   Procedures/Studies: Dg Chest Port 1 View  Result Date: 01/19/2019 CLINICAL DATA:  Altered mental status. Weakness. EXAM: PORTABLE CHEST 1 VIEW COMPARISON:  Radiograph 03/22/2015 FINDINGS: Low lung volumes. Post median sternotomy. Unchanged heart size and mediastinal contours with aortic atherosclerosis and tortuosity. Ill-defined bibasilar opacities. No pulmonary edema. No pneumothorax or large pleural effusion. Chronic degenerative change of both shoulders, right greater than left. IMPRESSION: 1. Low lung volumes with patchy bibasilar opacities favoring atelectasis. 2. Unchanged aortic tortuosity. Aortic Atherosclerosis (ICD10-I70.0). Electronically Signed   By: Narda Rutherford M.D.    On: 01/19/2019 19:39     Discharge Exam: Vitals:   01/20/19 2144 01/21/19 0459  BP: 115/61 123/61  Pulse: 66 68  Resp: 16 16  Temp: 97.8 F (36.6 C) 97.8 F (36.6 C)  SpO2: 99% 100%   Vitals:   01/20/19 1515 01/20/19 2144 01/21/19 0459 01/21/19 0500  BP: (!) 107/57 115/61 123/61   Pulse: 62 66 68   Resp: 15 16 16    Temp: 98.4 F (36.9 C) 97.8 F (36.6 C) 97.8 F (36.6 C)   TempSrc: Oral Oral Oral   SpO2: 97% 99% 100%   Weight:    61.7 kg  Height:        General: Pt is alert, awake, not in acute distress Cardiovascular: RRR, S1/S2 +, no rubs, no gallops Respiratory: CTA bilaterally, no wheezing, no rhonchi Abdominal: Soft, NT, ND, bowel sounds + Extremities: no edema, no cyanosis    The results of significant diagnostics from this hospitalization (including imaging, microbiology, ancillary and laboratory) are listed below for reference.     Microbiology: Recent Results (from the past 240 hour(s))  Culture, blood (Routine X 2) w Reflex to ID Panel     Status: None (Preliminary result)   Collection Time: 01/19/19  7:38 PM  Result Value Ref Range Status   Specimen Description   Final    BLOOD RIGHT ARM Performed at [facility], [address], [place], [place] [address]    Special Requests   Final  BOTTLES DRAWN AEROBIC AND ANAEROBIC Blood Culture adequate volume Performed at [facility], [address], [place], [place] [address]    Culture  Setup Time   Final    GRAM POSITIVE COCCI IN BOTH AEROBIC AND ANAEROBIC BOTTLES Gram Stain Report Called to,Read Back By and Verified With: DARLING J. AT 1432 ON [id number] BY THOMPSON S. Organism ID to follow CRITICAL RESULT CALLED TO, READ BACK BY AND VERIFIED WITH: T HANDY RN 1929 01/20/19 A BROWNING    Culture   Final    TOO YOUNG TO READ Performed at [facility], [address], [place], [place] [address]    Report Status PENDING  Incomplete  Blood Culture ID Panel (Reflexed)     Status: Abnormal    Collection Time: 01/19/19  7:38 PM  Result Value Ref Range Status   Enterococcus species NOT DETECTED NOT DETECTED Final   Listeria monocytogenes NOT DETECTED NOT DETECTED Final   Staphylococcus species DETECTED (A) NOT DETECTED Final    Comment: Methicillin (oxacillin) susceptible coagulase negative staphylococcus. Possible blood culture contaminant (unless isolated from more than one blood culture draw or clinical case suggests pathogenicity). No antibiotic treatment is indicated for blood  culture contaminants. CRITICAL RESULT CALLED TO, READ BACK BY AND VERIFIED WITH: T HANDY RN 1929 01/20/19 A BROWNING    Staphylococcus aureus (BCID) NOT DETECTED NOT DETECTED Final   Methicillin resistance NOT DETECTED NOT DETECTED Final   Streptococcus species NOT DETECTED NOT DETECTED Final   Streptococcus agalactiae NOT DETECTED NOT DETECTED Final   Streptococcus pneumoniae NOT DETECTED NOT DETECTED Final   Streptococcus pyogenes NOT DETECTED NOT DETECTED Final   Acinetobacter baumannii NOT DETECTED NOT DETECTED Final   Enterobacteriaceae species NOT DETECTED NOT DETECTED Final   Enterobacter cloacae complex NOT DETECTED NOT DETECTED Final   Escherichia coli NOT DETECTED NOT DETECTED Final   Klebsiella oxytoca NOT DETECTED NOT DETECTED Final   Klebsiella pneumoniae NOT DETECTED NOT DETECTED Final   Proteus species NOT DETECTED NOT DETECTED Final   Serratia marcescens NOT DETECTED NOT DETECTED Final   Haemophilus influenzae NOT DETECTED NOT DETECTED Final   Neisseria meningitidis NOT DETECTED NOT DETECTED Final   Pseudomonas aeruginosa NOT DETECTED NOT DETECTED Final   Candida albicans NOT DETECTED NOT DETECTED Final   Candida glabrata NOT DETECTED NOT DETECTED Final   Candida krusei NOT DETECTED NOT DETECTED Final   Candida parapsilosis NOT DETECTED NOT DETECTED Final   Candida tropicalis NOT DETECTED NOT DETECTED Final    Comment: Performed at [facility], [address]  [place], [place] [address]  Culture, blood (Routine X 2) w Reflex to ID Panel     Status: None (Preliminary result)   Collection Time: 01/19/19  7:47 PM  Result Value Ref Range Status   Specimen Description   Final    BLOOD RIGHT HAND Performed at [facility], [address], [place], [place] [address]    Special Requests   Final    BOTTLES DRAWN AEROBIC ONLY Blood Culture adequate volume Performed at [facility], [address] [place], [place] [address]    Culture  Setup Time   Final    GRAM POSITIVE COCCI AEROBIC BOTTLE ONLY CRITICAL VALUE NOTED.  VALUE IS CONSISTENT WITH PREVIOUSLY REPORTED AND CALLED VALUE. GRAM STAIN REVIEWED-AGREE WITH RESULT T. TYSOR Performed at [facility], [address] [place], [place] [address]    Culture   Final    NO GROWTH 2 DAYS Performed at  [facility], [address], [place], [place] [address]    Report Status PENDING  Incomplete  MRSA PCR Screening     Status: None   Collection Time: 01/20/19  3:35 AM  Result Value Ref Range Status   MRSA by PCR NEGATIVE NEGATIVE Final    Comment:        The GeneXpert MRSA Assay (FDA approved for NASAL specimens only), is one component of a comprehensive MRSA colonization surveillance program. It is not intended to diagnose MRSA infection nor to guide or monitor treatment for MRSA infections. Performed at [facility], [address], [place], [place] [address]      Labs: BNP (last 3 results) No results for input(s): BNP in the last 8760 hours. Basic Metabolic Panel: Recent Labs  Lab 01/19/19 1938 01/20/19 0510 01/21/19 0435  NA 135 139 139  K 3.9 3.8 3.5  CL 104 104 109  CO2 GLUCOSE 114* 105* 92  BUN 40* 33* 25*  CREATININE 1.66* 1.32* 0.93  CALCIUM 8.1* 8.3* 8.1*  MG  --  1.7  --    Liver Function Tests: Recent Labs  Lab 01/19/19 1938 01/20/19 0510  AST 43* 42*  ALT 30 30  ALKPHOS 73 70  BILITOT 0.9 1.0  PROT 6.0* 5.7*  ALBUMIN 3.6 3.3*   No results for  input(s): LIPASE, AMYLASE in the last 168 hours. No results for input(s): AMMONIA in the last 168 hours. CBC: Recent Labs  Lab 01/19/19 1938 01/20/19 0004 01/21/19 0435  WBC 5.7 5.7 5.2  NEUTROABS 4.2 3.9  --   HGB 12.1* 12.4* 12.1*  HCT 35.4* 37.3* 35.4*  MCV 105.7* 108.4* 103.8*  PLT 107* 97* 88*   Cardiac Enzymes: Recent Labs  Lab 01/19/19 1938 01/19/19 1947  CKTOTAL  --  127  TROPONINI <0.03  --    BNP: Invalid input(s): POCBNP CBG: No results for input(s): GLUCAP in the last 168 hours. D-Dimer No results for input(s): DDIMER in the last 72 hours. Hgb A1c No results for input(s): HGBA1C in the last 72 hours. Lipid Profile No results for input(s): CHOL, HDL, LDLCALC, TRIG, CHOLHDL, LDLDIRECT in the last 72 hours. Thyroid function studies Recent Labs    01/19/19 1947  TSH 1.638   Anemia work up No results for input(s): VITAMINB12, FOLATE, FERRITIN, TIBC, IRON, RETICCTPCT in the last 72 hours. Urinalysis    Component Value Date/Time   COLORURINE YELLOW 01/19/2019 1950   APPEARANCEUR HAZY (A) 01/19/2019 1950   LABSPEC 1.019 01/19/2019 1950   PHURINE 5.0 01/19/2019 1950   GLUCOSEU NEGATIVE 01/19/2019 1950   HGBUR SMALL (A) 01/19/2019 1950   BILIRUBINUR NEGATIVE 01/19/2019 1950   KETONESUR NEGATIVE 01/19/2019 1950   PROTEINUR NEGATIVE 01/19/2019 1950   UROBILINOGEN 1.0 03/22/2015 1903   NITRITE NEGATIVE 01/19/2019 1950   LEUKOCYTESUR NEGATIVE 01/19/2019 1950   Sepsis Labs Invalid input(s): PROCALCITONIN,  WBC,  LACTICIDVEN Microbiology Recent Results (from the past 240 hour(s))  Culture, blood (Routine X 2) w Reflex to ID Panel     Status: None (Preliminary result)   Collection Time: 01/19/19  7:38 PM  Result Value Ref Range Status   Specimen Description   Final    BLOOD RIGHT ARM Performed at [facility], [address] [place], [place] [address]    Special Requests   Final    BOTTLES DRAWN AEROBIC AND ANAEROBIC Blood Culture adequate  volume Performed at [facility], [address] [place], [place] [address]  Culture  Setup Time   Final    GRAM POSITIVE COCCI IN BOTH AEROBIC AND ANAEROBIC BOTTLES Gram Stain Report Called to,Read Back By and Verified With: DARLING J. AT 1432 ON [id number] BY THOMPSON S. Organism ID to follow CRITICAL RESULT CALLED TO, READ BACK BY AND VERIFIED WITH: T HANDY RN 1929 01/20/19 A BROWNING    Culture   Final    TOO YOUNG TO READ Performed at [facility], [address], [place], [place] [address]    Report Status PENDING  Incomplete  Blood Culture ID Panel (Reflexed)     Status: Abnormal   Collection Time: 01/19/19  7:38 PM  Result Value Ref Range Status   Enterococcus species NOT DETECTED NOT DETECTED Final   Listeria monocytogenes NOT DETECTED NOT DETECTED Final   Staphylococcus species DETECTED (A) NOT DETECTED Final    Comment: Methicillin (oxacillin) susceptible coagulase negative staphylococcus. Possible blood culture contaminant (unless isolated from more than one blood culture draw or clinical case suggests pathogenicity). No antibiotic treatment is indicated for blood  culture contaminants. CRITICAL RESULT CALLED TO, READ BACK BY AND VERIFIED WITH: T HANDY RN 1929 01/20/19 A BROWNING    Staphylococcus aureus (BCID) NOT DETECTED NOT DETECTED Final   Methicillin resistance NOT DETECTED NOT DETECTED Final   Streptococcus species NOT DETECTED NOT DETECTED Final   Streptococcus agalactiae NOT DETECTED NOT DETECTED Final   Streptococcus pneumoniae NOT DETECTED NOT DETECTED Final   Streptococcus pyogenes NOT DETECTED NOT DETECTED Final   Acinetobacter baumannii NOT DETECTED NOT DETECTED Final   Enterobacteriaceae species NOT DETECTED NOT DETECTED Final   Enterobacter cloacae complex NOT DETECTED NOT DETECTED Final   Escherichia coli NOT DETECTED NOT DETECTED Final   Klebsiella oxytoca NOT DETECTED NOT DETECTED Final   Klebsiella pneumoniae NOT DETECTED NOT DETECTED Final    Proteus species NOT DETECTED NOT DETECTED Final   Serratia marcescens NOT DETECTED NOT DETECTED Final   Haemophilus influenzae NOT DETECTED NOT DETECTED Final   Neisseria meningitidis NOT DETECTED NOT DETECTED Final   Pseudomonas aeruginosa NOT DETECTED NOT DETECTED Final   Candida albicans NOT DETECTED NOT DETECTED Final   Candida glabrata NOT DETECTED NOT DETECTED Final   Candida krusei NOT DETECTED NOT DETECTED Final   Candida parapsilosis NOT DETECTED NOT DETECTED Final   Candida tropicalis NOT DETECTED NOT DETECTED Final    Comment: Performed at [facility], [address], [place], [place] [address]  Culture, blood (Routine X 2) w Reflex to ID Panel     Status: None (Preliminary result)   Collection Time: 01/19/19  7:47 PM  Result Value Ref Range Status   Specimen Description   Final    BLOOD RIGHT HAND Performed at [facility], [address] [place], [place] [address]    Special Requests   Final    BOTTLES DRAWN AEROBIC ONLY Blood Culture adequate volume Performed at [facility], [address], [place], [place] [address]    Culture  Setup Time   Final    GRAM POSITIVE COCCI AEROBIC BOTTLE ONLY CRITICAL VALUE NOTED.  VALUE IS CONSISTENT WITH PREVIOUSLY REPORTED AND CALLED VALUE. GRAM STAIN REVIEWED-AGREE WITH RESULT T. TYSOR Performed at [facility], [address], [place], [place] [address]    Culture   Final    NO GROWTH 2 DAYS Performed at [facility], [address] [place], [place] [address]    Report Status PENDING  Incomplete  MRSA PCR Screening  Status: None   Collection Time: 01/20/19  3:35 AM  Result Value Ref Range Status   MRSA by PCR NEGATIVE NEGATIVE Final    Comment:        The GeneXpert MRSA Assay (FDA approved for NASAL specimens only), is one component of a comprehensive MRSA colonization surveillance program. It is not intended to diagnose MRSA infection nor to guide or monitor treatment for MRSA infections. Performed at  [facility], [address] [place], [place] [address]      Time coordinating discharge: 35 minutes  SIGNED:   Erick Blinks, DO [facility] 01/21/2019, 9:57 AM  If 7PM-7AM, please contact night-coverage [email or web address] Password [email or web address]

## 2019-01-21 NOTE — Progress Notes (Signed)
Removed IV-clean, dry, intact. Wheeled stable patient to short stay entrance where his wife and daughter picked him up. I reviewed d/c paperwork with patient, daughter, and wife. I reviewed new medications and where to pick up. Answered all questions.

## 2019-01-22 ENCOUNTER — Other Ambulatory Visit: Payer: Self-pay

## 2019-01-22 ENCOUNTER — Inpatient Hospital Stay (HOSPITAL_COMMUNITY)
Admission: AD | Admit: 2019-01-22 | Discharge: 2019-01-25 | DRG: 314 | Disposition: A | Discharge status: Home Health | Payer: Medicare Other | Source: Ambulatory Visit | Attending: Internal Medicine | Admitting: Internal Medicine

## 2019-01-22 ENCOUNTER — Encounter (HOSPITAL_COMMUNITY): Payer: Self-pay

## 2019-01-22 DIAGNOSIS — R7881 Bacteremia: Secondary | ICD-10-CM | POA: Diagnosis present

## 2019-01-22 DIAGNOSIS — N189 Chronic kidney disease, unspecified: Secondary | ICD-10-CM | POA: Diagnosis present

## 2019-01-22 DIAGNOSIS — I130 Hypertensive heart and chronic kidney disease with heart failure and stage 1 through stage 4 chronic kidney disease, or unspecified chronic kidney disease: Secondary | ICD-10-CM | POA: Diagnosis present

## 2019-01-22 DIAGNOSIS — Z951 Presence of aortocoronary bypass graft: Secondary | ICD-10-CM | POA: Diagnosis not present

## 2019-01-22 DIAGNOSIS — I330 Acute and subacute infective endocarditis: Secondary | ICD-10-CM | POA: Diagnosis present

## 2019-01-22 DIAGNOSIS — I5032 Chronic diastolic (congestive) heart failure: Secondary | ICD-10-CM | POA: Diagnosis present

## 2019-01-22 DIAGNOSIS — G9341 Metabolic encephalopathy: Secondary | ICD-10-CM | POA: Diagnosis present

## 2019-01-22 DIAGNOSIS — F0391 Unspecified dementia with behavioral disturbance: Secondary | ICD-10-CM | POA: Diagnosis present

## 2019-01-22 DIAGNOSIS — T826XXA Infection and inflammatory reaction due to cardiac valve prosthesis, initial encounter: Secondary | ICD-10-CM | POA: Diagnosis present

## 2019-01-22 DIAGNOSIS — E785 Hyperlipidemia, unspecified: Secondary | ICD-10-CM | POA: Diagnosis present

## 2019-01-22 DIAGNOSIS — Z953 Presence of xenogenic heart valve: Secondary | ICD-10-CM | POA: Diagnosis not present

## 2019-01-22 DIAGNOSIS — I2510 Atherosclerotic heart disease of native coronary artery without angina pectoris: Secondary | ICD-10-CM | POA: Diagnosis present

## 2019-01-22 DIAGNOSIS — I350 Nonrheumatic aortic (valve) stenosis: Secondary | ICD-10-CM

## 2019-01-22 DIAGNOSIS — R410 Disorientation, unspecified: Secondary | ICD-10-CM | POA: Diagnosis present

## 2019-01-22 DIAGNOSIS — I10 Essential (primary) hypertension: Secondary | ICD-10-CM

## 2019-01-22 HISTORY — DX: Nonrheumatic aortic (valve) stenosis: I35.0

## 2019-01-22 HISTORY — DX: Atherosclerotic heart disease of native coronary artery without angina pectoris: I25.10

## 2019-01-22 HISTORY — DX: Hyperlipidemia, unspecified: E78.5

## 2019-01-22 HISTORY — DX: Chronic diastolic (congestive) heart failure: I50.32

## 2019-01-22 HISTORY — DX: Essential (primary) hypertension: I10

## 2019-01-22 HISTORY — PX: HERNIA REPAIR: SHX51

## 2019-01-22 MED ORDER — LISINOPRIL 10 MG PO TABS
10.0000 mg | ORAL_TABLET | Freq: Every day | ORAL | Status: DC
  Administered 2019-01-23 – 2019-01-25 (×3): 10 mg via ORAL
  Filled 2019-01-22 (×3): qty 1

## 2019-01-22 MED ORDER — ENSURE ENLIVE PO LIQD
237.0000 mL | Freq: Two times a day (BID) | ORAL | Status: DC
  Administered 2019-01-22 – 2019-01-25 (×5): 237 mL via ORAL

## 2019-01-22 MED ORDER — HALOPERIDOL LACTATE 5 MG/ML IJ SOLN
2.0000 mg | Freq: Once | INTRAMUSCULAR | Status: DC
  Filled 2019-01-22: qty 1

## 2019-01-22 MED ORDER — ASPIRIN 81 MG PO CHEW
81.0000 mg | CHEWABLE_TABLET | Freq: Every day | ORAL | Status: DC
  Administered 2019-01-23 – 2019-01-25 (×3): 81 mg via ORAL
  Filled 2019-01-22 (×3): qty 1

## 2019-01-22 MED ORDER — CEFAZOLIN SODIUM-DEXTROSE 2-4 GM/100ML-% IV SOLN
2.0000 g | Freq: Three times a day (TID) | INTRAVENOUS | Status: DC
  Administered 2019-01-22 – 2019-01-25 (×10): 2 g via INTRAVENOUS
  Filled 2019-01-22 (×10): qty 100

## 2019-01-22 MED ORDER — DOCUSATE SODIUM 100 MG PO CAPS: 100.0000 mg | ORAL_CAPSULE | Freq: Every day | ORAL | Status: DC | PRN

## 2019-01-22 MED ORDER — SODIUM CHLORIDE 0.9% FLUSH
3.0000 mL | Freq: Two times a day (BID) | INTRAVENOUS | Status: DC
  Administered 2019-01-22 – 2019-01-25 (×5): 3 mL via INTRAVENOUS

## 2019-01-22 MED ORDER — SODIUM CHLORIDE 0.9 % IV SOLN
250.0000 mL | INTRAVENOUS | Status: DC | PRN
  Administered 2019-01-24: 250 mL via INTRAVENOUS

## 2019-01-22 MED ORDER — OMEGA-3-ACID ETHYL ESTERS 1 G PO CAPS
1.0000 g | ORAL_CAPSULE | Freq: Every day | ORAL | Status: DC
  Administered 2019-01-23 – 2019-01-25 (×3): 1 g via ORAL
  Filled 2019-01-22 (×3): qty 1

## 2019-01-22 MED ORDER — ACETAMINOPHEN 325 MG PO TABS: 650.0000 mg | ORAL_TABLET | Freq: Four times a day (QID) | ORAL | Status: DC | PRN

## 2019-01-22 MED ORDER — LORATADINE 10 MG PO TABS
10.0000 mg | ORAL_TABLET | Freq: Every day | ORAL | Status: DC
  Administered 2019-01-23 – 2019-01-25 (×3): 10 mg via ORAL
  Filled 2019-01-22 (×3): qty 1

## 2019-01-22 MED ORDER — SIMVASTATIN 20 MG PO TABS
20.0000 mg | ORAL_TABLET | Freq: Every day | ORAL | Status: DC
  Administered 2019-01-22 – 2019-01-24 (×3): 20 mg via ORAL
  Filled 2019-01-22 (×3): qty 1

## 2019-01-22 MED ORDER — ONDANSETRON HCL 4 MG/2ML IJ SOLN: 4.0000 mg | Freq: Four times a day (QID) | INTRAMUSCULAR | Status: DC | PRN

## 2019-01-22 MED ORDER — SODIUM CHLORIDE 0.9% FLUSH: 3.0000 mL | INTRAVENOUS | Status: DC | PRN

## 2019-01-22 MED ORDER — ADULT MULTIVITAMIN W/MINERALS CH
1.0000 | ORAL_TABLET | Freq: Every day | ORAL | Status: DC
  Filled 2019-01-22 (×2): qty 1

## 2019-01-22 MED ORDER — ADULT MULTIVITAMIN LIQUID CH
15.0000 mL | Freq: Every day | ORAL | Status: DC
  Administered 2019-01-23 – 2019-01-25 (×3): 15 mL via ORAL
  Filled 2019-01-22 (×3): qty 15

## 2019-01-22 MED ORDER — LISINOPRIL-HYDROCHLOROTHIAZIDE 10-12.5 MG PO TABS: 1.0000 | ORAL_TABLET | Freq: Every day | ORAL | Status: DC

## 2019-01-22 MED ORDER — ACETAMINOPHEN 650 MG RE SUPP: 650.0000 mg | Freq: Four times a day (QID) | RECTAL | Status: DC | PRN

## 2019-01-22 MED ORDER — ONDANSETRON HCL 4 MG PO TABS: 4.0000 mg | ORAL_TABLET | Freq: Four times a day (QID) | ORAL | Status: DC | PRN

## 2019-01-22 MED ORDER — HYDROCHLOROTHIAZIDE 12.5 MG PO CAPS
12.5000 mg | ORAL_CAPSULE | Freq: Every day | ORAL | Status: DC
  Administered 2019-01-23 – 2019-01-25 (×3): 12.5 mg via ORAL
  Filled 2019-01-22 (×3): qty 1

## 2019-01-22 NOTE — Progress Notes (Signed)
Was oriented x 3 when admitted earlier and has become more confused toward this evening.   Moved closer to nurse's station from 325 to 328.  Bed alarm set and encouraged to use call light when needing to get up.  Stated used cane at home and was assisted to bathroom, walking slowly.  Voided in toilet.

## 2019-01-22 NOTE — Progress Notes (Signed)
Pharmacy Antibiotic Note  Terry Barrett is a 85 y.o. male admitted on 01/22/2019 with bacteremia.  Pharmacy has been consulted for cefazolin dosing.  Plan: Cefazolin 2gm IV q8h F/U cxs and clinical progress Monitor V/S, labs    No data recorded.  Recent Labs  Lab 01/19/19 1938 01/20/19 0004 01/20/19 0510 01/21/19 0435  WBC 5.7 5.7  --  5.2  CREATININE 1.66*  --  1.32* 0.93  LATICACIDVEN 1.0  --   --   --     Estimated Creatinine Clearance: 47.9 mL/min (by C-G formula based on SCr of 0.93 mg/dL).    Allergies  Allergen Reactions  . Sulfamethoxazole-Trimethoprim Itching    Antimicrobials this admission: Cefazolin 4/11 >>  Cefepime 2gm IV and Vancomycin 1gm IV given x 1 dose on 4/8  Dose adjustments this admission: n/a  Microbiology results: 4/8 BCx: x 2 bottles + STAPHYLOCOCCUS LUGDUNENSIS 4/9 MRSA PCR: negative  Thank you for allowing pharmacy to be a part of this patient's care.  Elder Cyphers, BS Loura Back, [place] Clinical Pharmacist Pager [phone] 01/22/2019 1:54 PM

## 2019-01-22 NOTE — H&P (Signed)
PROGRESS NOTE    Terry [facility]  ZOX:[id number] DOB: 1928-09-25 DOA: 01/22/2019 PCP: Theodoro Kos, MD   Brief Narrative:  Terry Pino [name] a 85 y.o.malewith medical history significant forcoronary artery disease status post three-vessel CABGin2010, aortic stenosis status post bioprosthetic aortic valve replacement, chronic diastolic heart failure, hypertension, [facility] with acute metabolic encephalopathy in the setting of suspected bibasilar pneumonia after presenting [facility] ED[facility].  He was treated with Rocephin and azithromycin and subsequently discharged on 4/10 in stable condition with plans to continue the current antibiotics.  His mentation had significantly improved.  Unfortunately, blood cultures have resulted on 4/11 with 2 sets positive for staphylococcal bacteremia for which patient was directly admitted back to the hospital in order to initiate treatment with IV Ancef and perform TEE for further evaluation of his bioprosthetic aortic valve.  He has actually remained in stable condition and per his wife and the patient, he has been doing quite well at home.   Assessment & Plan:   Active Problems:   Bacteremia   1. Staphylococcal bacteremia.  We will plan to start IV Ancef as bacteria is oxacillin sensitive.  Appreciate pharmacy for dosing and management.  Plan to repeat blood cultures in a.m. and check CBC.  If cultures remain negative, anticipate placing PICC line at that time for potential long-term treatment.  Will consult with cardiology on 4/13 for TEE evaluation of his bioprosthetic valve. 2. CKD.  Will monitor with a.m. labs. 3. Chronic diastolic heart failure.  No need for IV fluid noted at this time.  Will monitor with daily weights. 4. Essential hypertension.  Continue home medication and monitor blood pressure readings. 5. Dyslipidemia.  Continue simvastatin.   DVT  prophylaxis: SCDs Code Status: Full Family Communication: Spoke with wife on phone Disposition Plan: Start IV Ancef and monitor blood cultures by a.m. anticipate consultation with cardiology by Monday 4/13 for TEE.   Consultants:   None  Procedures:   None  Antimicrobials:   IV Ancef 4/11->   Subjective: Patient seen and evaluated today with no new acute complaints or concerns. No acute concerns or events noted overnight.  Objective: There were no vitals filed for this visit. No intake or output data in the 24 hours ending 01/22/19 1355 There were no vitals filed for this visit.  Examination:  General exam: Appears calm and comfortable  Respiratory system: Clear to auscultation. Respiratory effort normal. Cardiovascular system: S1 & S2 heard, RRR. No JVD, murmurs, rubs, gallops or clicks. No pedal edema. Gastrointestinal system: Abdomen is nondistended, soft and nontender. No organomegaly or masses felt. Normal bowel sounds heard. Central nervous system: Alert and oriented. No focal neurological deficits. Extremities: Symmetric 5 x 5 power. Skin: No rashes, lesions or ulcers Psychiatry: Judgement and insight appear normal. Mood & affect appropriate.     Data Reviewed: I have personally reviewed following labs and imaging studies  CBC: Recent Labs  Lab 01/19/19 1938 01/20/19 0004 01/21/19 0435  WBC 5.7 5.7 5.2  NEUTROABS 4.2 3.9  --   HGB 12.1* 12.4* 12.1*  HCT 35.4* 37.3* 35.4*  MCV 105.7* 108.4* 103.8*  PLT 107* 97* 88*   Basic Metabolic Panel: Recent Labs  Lab 01/19/19 1938 01/20/19 0510 01/21/19 0435  NA 135 139 139  K 3.9 3.8 3.5  CL 104 104 109  CO2 GLUCOSE 114* 105* 92  BUN 40* 33* 25*  CREATININE 1.66* 1.32* 0.93  CALCIUM 8.1* 8.3* 8.1*  MG  --  1.7  --    GFR: Estimated Creatinine Clearance: 47.9 mL/min (by C-G formula based on SCr of 0.93 mg/dL). Liver Function Tests: Recent Labs  Lab 01/19/19 1938 01/20/19 0510  AST 43*  42*  ALT 30 30  ALKPHOS 73 70  BILITOT 0.9 1.0  PROT 6.0* 5.7*  ALBUMIN 3.6 3.3*   No results for input(s): LIPASE, AMYLASE in the last 168 hours. No results for input(s): AMMONIA in the last 168 hours. Coagulation Profile: No results for input(s): INR, PROTIME in the last 168 hours. Cardiac Enzymes: Recent Labs  Lab 01/19/19 1938 01/19/19 1947  CKTOTAL  --  127  TROPONINI <0.03  --    BNP (last 3 results) No results for input(s): PROBNP in the last 8760 hours. HbA1C: No results for input(s): HGBA1C in the last 72 hours. CBG: No results for input(s): GLUCAP in the last 168 hours. Lipid Profile: No results for input(s): CHOL, HDL, LDLCALC, TRIG, CHOLHDL, LDLDIRECT in the last 72 hours. Thyroid Function Tests: Recent Labs    01/19/19 1947  TSH 1.638   Anemia Panel: No results for input(s): VITAMINB12, FOLATE, FERRITIN, TIBC, IRON, RETICCTPCT in the last 72 hours. Sepsis Labs: Recent Labs  Lab 01/19/19 1938  LATICACIDVEN 1.0    Recent Results (from the past 240 hour(s))  Culture, blood (Routine X 2) w Reflex to ID Panel     Status: Abnormal (Preliminary result)   Collection Time: 01/19/19  7:38 PM  Result Value Ref Range Status   Specimen Description   Final    BLOOD RIGHT ARM Performed at [facility], [address] [place], [place] [address]    Special Requests   Final    BOTTLES DRAWN AEROBIC AND ANAEROBIC Blood Culture adequate volume Performed at [facility], [address] [place], [place] [address]    Culture  Setup Time   Final    GRAM POSITIVE COCCI Gram Stain Report Called to,Read Back By and Verified With: DARLING J. AT 1432 ON [id number] BY THOMPSON S. Organism ID to follow CRITICAL RESULT CALLED TO, READ BACK BY AND VERIFIED WITH: T HANDY RN 1929 01/20/19 A BROWNING    Culture (A)  Final    STAPHYLOCOCCUS LUGDUNENSIS SUSCEPTIBILITIES TO FOLLOW Performed at [facility], [address] [place], [place] [address]    Report Status PENDING   Incomplete  Blood Culture ID Panel (Reflexed)     Status: Abnormal   Collection Time: 01/19/19  7:38 PM  Result Value Ref Range Status   Enterococcus species NOT DETECTED NOT DETECTED Final   Listeria monocytogenes NOT DETECTED NOT DETECTED Final   Staphylococcus species DETECTED (A) NOT DETECTED Final    Comment: Methicillin (oxacillin) susceptible coagulase negative staphylococcus. Possible blood culture contaminant (unless isolated from more than one blood culture draw or clinical case suggests pathogenicity). No antibiotic treatment is indicated for blood  culture contaminants. CRITICAL RESULT CALLED TO, READ BACK BY AND VERIFIED WITH: T HANDY RN 1929 01/20/19 A BROWNING    Staphylococcus aureus (BCID) NOT DETECTED NOT DETECTED Final   Methicillin resistance NOT DETECTED NOT DETECTED Final   Streptococcus species NOT DETECTED NOT DETECTED Final   Streptococcus agalactiae NOT DETECTED NOT DETECTED Final   Streptococcus pneumoniae NOT DETECTED NOT DETECTED Final   Streptococcus pyogenes NOT DETECTED NOT DETECTED Final   Acinetobacter baumannii NOT DETECTED NOT DETECTED Final   Enterobacteriaceae species NOT DETECTED NOT DETECTED Final   Enterobacter cloacae complex  NOT DETECTED NOT DETECTED Final   Escherichia coli NOT DETECTED NOT DETECTED Final   Klebsiella oxytoca NOT DETECTED NOT DETECTED Final   Klebsiella pneumoniae NOT DETECTED NOT DETECTED Final   Proteus species NOT DETECTED NOT DETECTED Final   Serratia marcescens NOT DETECTED NOT DETECTED Final   Haemophilus influenzae NOT DETECTED NOT DETECTED Final   Neisseria meningitidis NOT DETECTED NOT DETECTED Final   Pseudomonas aeruginosa NOT DETECTED NOT DETECTED Final   Candida albicans NOT DETECTED NOT DETECTED Final   Candida glabrata NOT DETECTED NOT DETECTED Final   Candida krusei NOT DETECTED NOT DETECTED Final   Candida parapsilosis NOT DETECTED NOT DETECTED Final   Candida tropicalis NOT DETECTED NOT DETECTED Final     Comment: Performed at [facility], [address], [place], [place] [address]  Culture, blood (Routine X 2) w Reflex to ID Panel     Status: Abnormal (Preliminary result)   Collection Time: 01/19/19  7:47 PM  Result Value Ref Range Status   Specimen Description   Final    BLOOD RIGHT HAND Performed at [facility], [address], [place], [place] [address]    Special Requests   Final    BOTTLES DRAWN AEROBIC ONLY Blood Culture adequate volume Performed at [facility], [address] [place], [place] [address]    Culture  Setup Time   Final    GRAM POSITIVE COCCI AEROBIC BOTTLE ONLY CRITICAL VALUE NOTED.  VALUE IS CONSISTENT WITH PREVIOUSLY REPORTED AND CALLED VALUE. GRAM STAIN REVIEWED-AGREE WITH RESULT T. TYSOR Performed at [facility], [address] [place], [place] [address]    Culture STAPHYLOCOCCUS LUGDUNENSIS (A)  Final   Report Status PENDING  Incomplete  MRSA PCR Screening     Status: None   Collection Time: 01/20/19  3:35 AM  Result Value Ref Range Status   MRSA by PCR NEGATIVE NEGATIVE Final    Comment:        The GeneXpert MRSA Assay (FDA approved for NASAL specimens only), is one component of a comprehensive MRSA colonization surveillance program. It is not intended to diagnose MRSA infection nor to guide or monitor treatment for MRSA infections. Performed at [facility], [address], [place], [place] [address]          Radiology Studies: No results found.      Scheduled Meds: . aspirin  81 mg Oral Daily  . feeding supplement (ENSURE ENLIVE)  237 mL Oral BID BM  . Fish Oil  1,200 mg Oral Daily  . [START ON 01/23/2019] lisinopril-hydrochlorothiazide  1 tablet Oral Daily  . loratadine  10 mg Oral Daily  . multivitamin-iron-minerals-folic acid  1 tablet Oral Daily  . simvastatin  20 mg Oral Daily  . sodium chloride flush  3 mL Intravenous Q12H   Continuous Infusions: . sodium chloride    .  ceFAZolin (ANCEF) IV       LOS: 0  days    Time spent: 30 minutes    Terry Dubey Hoover Brunette, DO [facility] Pager [phone]  If 7PM-7AM, please contact night-coverage [email or web address] Password [email or web address] 01/22/2019, 1:55 PM

## 2019-01-23 ENCOUNTER — Inpatient Hospital Stay (HOSPITAL_COMMUNITY): Payer: Medicare Other

## 2019-01-23 ENCOUNTER — Encounter (HOSPITAL_COMMUNITY): Payer: Self-pay | Admitting: *Deleted

## 2019-01-23 DIAGNOSIS — R7881 Bacteremia: Secondary | ICD-10-CM

## 2019-01-23 LAB — BASIC METABOLIC PANEL
Anion gap: 8 (ref 5–15)
BUN: 24 mg/dL — ABNORMAL HIGH (ref 8–23)
CO2: 27 mmol/L (ref 22–32)
Calcium: 8.7 mg/dL — ABNORMAL LOW (ref 8.9–10.3)
Chloride: 104 mmol/L (ref 98–111)
Creatinine, Ser: 0.99 mg/dL (ref 0.61–1.24)
GFR calc Af Amer: >60 mL/min (ref >60)
GFR calc non Af Amer: >60 mL/min (ref >60)
Glucose, Bld: 94 mg/dL (ref 70–99)
Potassium: 3.4 mmol/L — ABNORMAL LOW (ref 3.5–5.1)
Sodium: 139 mmol/L (ref 135–145)

## 2019-01-23 LAB — CBC
HCT: 34.5 % — ABNORMAL LOW (ref 39.0–52.0)
Hemoglobin: 11.7 g/dL — ABNORMAL LOW (ref 13.0–17.0)
MCH: 34.9 pg — ABNORMAL HIGH (ref 26.0–34.0)
MCHC: 33.9 g/dL (ref 30.0–36.0)
MCV: 103 fL — ABNORMAL HIGH (ref 80.0–100.0)
Platelets: 115 10*3/uL — ABNORMAL LOW (ref 150–400)
RBC: 3.35 MIL/uL — ABNORMAL LOW (ref 4.22–5.81)
RDW: 12.7 % (ref 11.5–15.5)
WBC: 5.3 10*3/uL (ref 4.0–10.5)
nRBC: 0 % (ref 0.0–0.2)

## 2019-01-23 LAB — CULTURE, BLOOD (ROUTINE X 2)
Special Requests: ADEQUATE
Special Requests: ADEQUATE

## 2019-01-23 LAB — ECHOCARDIOGRAM LIMITED
Height: 68 in
Weight: 2176.38 oz

## 2019-01-23 MED ORDER — POTASSIUM CHLORIDE CRYS ER 20 MEQ PO TBCR
40.0000 meq | EXTENDED_RELEASE_TABLET | Freq: Once | ORAL | Status: AC
  Administered 2019-01-23: 09:00:00 40 meq via ORAL
  Filled 2019-01-23: qty 2

## 2019-01-23 MED ORDER — HALOPERIDOL LACTATE 5 MG/ML IJ SOLN: 2.0000 mg | Freq: Four times a day (QID) | INTRAMUSCULAR | Status: DC | PRN

## 2019-01-23 NOTE — Progress Notes (Signed)
PROGRESS NOTE    Terry Barrett  WUJ:[id number] DOB: June 01, 1928 DOA: 01/22/2019 PCP: Theodoro Kos, MD   Brief Narrative:  Terry Barrett a 84 y.o.malewith medical history significant forcoronary artery disease status post three-vessel CABGin2010, aortic stenosis status post bioprosthetic aortic valve replacement, chronic diastolic heart failure, hypertension, [facility] acute metabolic encephalopathy in the setting of suspected bibasilar pneumonia after presenting [facility] ED[facility].  He was treated with Rocephin and azithromycin and subsequently discharged on 4/10 in stable condition with plans to continue the current antibiotics.  His mentation had significantly improved.  Unfortunately, blood cultures have resulted on 4/11 with 2 sets positive for staphylococcal bacteremia for which patient was directly admitted back to the [facility] in order to initiate treatment with IV Ancef and perform TEE for further evaluation of his bioprosthetic aortic valve.  He has actually remained in stable condition and per his wife and the patient, he has been doing quite well at home.  Assessment & Plan:   Active Problems:   Bacteremia   1. Staphylococcal bacteremia.    Continue IV Ancef and follow labs.  Repeat blood cultures ordered today and will follow prior to placement of PICC line.  2D echocardiogram ordered today as well and will consider consultation to cardiology for TEE as needed based on these results. 2. CKD.  Will monitor with a.m. labs. 3. Chronic diastolic heart failure.  No need for IV fluid noted at this time.  Will monitor with daily weights. 4. Essential hypertension.  Continue home medication and monitor blood pressure readings. 5. Dyslipidemia.  Continue simvastatin. 6. Dementia with agitation.  Haldol added as needed.   DVT prophylaxis: SCDs Code Status: Full Family Communication: Spoke  with wife on phone Disposition Plan:  Continue IV Ancef and monitor repeat blood cultures.  2D echocardiogram and consideration for TEE as needed.   Consultants:   None  Procedures:   None  Antimicrobials:   IV Ancef 4/11->  Subjective: Patient seen and evaluated today with no new acute complaints or concerns.  Patient was noted to have some agitation overnight which required use of Haldol.  He is resting comfortably this a.m.  Objective: Vitals:   01/22/19 1300 01/22/19 1540 01/22/19 2117  BP: 131/75 131/75 (!) 146/67  Pulse: 70 70 62  Resp: Temp: 98 F (36.7 C) 98 F (36.7 C)   TempSrc: Oral Oral   SpO2: 100%  100%  Weight:  61.7 kg   Height:   (1.727 m)     Intake/Output Summary (Last 24 hours) at 01/23/2019 0830 Last data filed at 01/23/2019 0500 Gross per 24 hour  Intake 440 ml  Output 1540 ml  Net -1100 ml   Filed Weights   01/22/19 1540  Weight: 61.7 kg    Examination:  General exam: Appears calm and comfortable, somnolent Respiratory system: Clear to auscultation. Respiratory effort normal. Cardiovascular system: S1 & S2 heard, RRR. No JVD, murmurs, rubs, gallops or clicks. No pedal edema. Gastrointestinal system: Abdomen is nondistended, soft and nontender. No organomegaly or masses felt. Normal bowel sounds heard. Central nervous system: Somnolent Extremities: Symmetric 5 x 5 power. Skin: No rashes, lesions or ulcers Psychiatry: Cannot be assessed.    Data Reviewed: I have personally reviewed following labs and imaging studies  CBC: Recent Labs  Lab 01/19/19 1938 01/20/19 0004 01/21/19 0435 01/23/19 0441  WBC 5.7 5.7 5.2 5.3  NEUTROABS 4.2  3.9  --   --   HGB 12.1* 12.4* 12.1* 11.7*  HCT 35.4* 37.3* 35.4* 34.5*  MCV 105.7* 108.4* 103.8* 103.0*  PLT 107* 97* 88* 115*   Basic Metabolic Panel: Recent Labs  Lab 01/19/19 1938 01/20/19 0510 01/21/19 0435 01/23/19 0441  NA 135 139 139 139  K 3.9 3.8 3.5 3.4*  CL  104 104 109 104  CO2 GLUCOSE 114* 105* 92 94  BUN 40* 33* 25* 24*  CREATININE 1.66* 1.32* 0.93 0.99  CALCIUM 8.1* 8.3* 8.1* 8.7*  MG  --  1.7  --   --    GFR: Estimated Creatinine Clearance: 44.1 mL/min (by C-G formula based on SCr of 0.99 mg/dL). Liver Function Tests: Recent Labs  Lab 01/19/19 1938 01/20/19 0510  AST 43* 42*  ALT 30 30  ALKPHOS 73 70  BILITOT 0.9 1.0  PROT 6.0* 5.7*  ALBUMIN 3.6 3.3*   No results for input(s): LIPASE, AMYLASE in the last 168 hours. No results for input(s): AMMONIA in the last 168 hours. Coagulation Profile: No results for input(s): INR, PROTIME in the last 168 hours. Cardiac Enzymes: Recent Labs  Lab 01/19/19 1938 01/19/19 1947  CKTOTAL  --  127  TROPONINI <0.03  --    BNP (last 3 results) No results for input(s): PROBNP in the last 8760 hours. HbA1C: No results for input(s): HGBA1C in the last 72 hours. CBG: No results for input(s): GLUCAP in the last 168 hours. Lipid Profile: No results for input(s): CHOL, HDL, LDLCALC, TRIG, CHOLHDL, LDLDIRECT in the last 72 hours. Thyroid Function Tests: No results for input(s): TSH, T4TOTAL, FREET4, T3FREE, THYROIDAB in the last 72 hours. Anemia Panel: No results for input(s): VITAMINB12, FOLATE, FERRITIN, TIBC, IRON, RETICCTPCT in the last 72 hours. Sepsis Labs: Recent Labs  Lab 01/19/19 1938  LATICACIDVEN 1.0    Recent Results (from the past 240 hour(s))  Culture, blood (Routine X 2) w Reflex to ID Panel     Status: Abnormal   Collection Time: 01/19/19  7:38 PM  Result Value Ref Range Status   Specimen Description   Final    BLOOD RIGHT ARM Performed at [facility], [address] [place], [place] [address]    Special Requests   Final    BOTTLES DRAWN AEROBIC AND ANAEROBIC Blood Culture adequate volume Performed at [facility], [address], [place], [place] [address]    Culture  Setup Time   Final    GRAM POSITIVE COCCI Gram Stain Report Called to,Read Back  By and Verified With: DARLING J. AT 1432 ON [id number] BY THOMPSON S. Organism ID to follow CRITICAL RESULT CALLED TO, READ BACK BY AND VERIFIED WITH: T HANDY RN 1929 01/20/19 A BROWNING Performed at [facility], [address], [place], [place] [address]    Culture STAPHYLOCOCCUS LUGDUNENSIS (A)  Final   Report Status 01/23/2019 FINAL  Final   Organism ID, Bacteria STAPHYLOCOCCUS LUGDUNENSIS  Final      Susceptibility   Staphylococcus lugdunensis - MIC*    CIPROFLOXACIN <=0.5 SENSITIVE Sensitive     ERYTHROMYCIN <=0.25 SENSITIVE Sensitive     GENTAMICIN <=0.5 SENSITIVE Sensitive     OXACILLIN 2 SENSITIVE Sensitive     TETRACYCLINE <=1 SENSITIVE Sensitive     VANCOMYCIN <=0.5 SENSITIVE Sensitive     TRIMETH/SULFA <=10 SENSITIVE Sensitive     CLINDAMYCIN <=0.25 SENSITIVE Sensitive     RIFAMPIN <=0.5 SENSITIVE Sensitive     Inducible Clindamycin  NEGATIVE Sensitive     * STAPHYLOCOCCUS LUGDUNENSIS  Blood Culture ID Panel (Reflexed)     Status: Abnormal   Collection Time: 01/19/19  7:38 PM  Result Value Ref Range Status   Enterococcus species NOT DETECTED NOT DETECTED Final   Listeria monocytogenes NOT DETECTED NOT DETECTED Final   Staphylococcus species DETECTED (A) NOT DETECTED Final    Comment: Methicillin (oxacillin) susceptible coagulase negative staphylococcus. Possible blood culture contaminant (unless isolated from more than one blood culture draw or clinical case suggests pathogenicity). No antibiotic treatment is indicated for blood  culture contaminants. CRITICAL RESULT CALLED TO, READ BACK BY AND VERIFIED WITH: T HANDY RN 1929 01/20/19 A BROWNING    Staphylococcus aureus (BCID) NOT DETECTED NOT DETECTED Final   Methicillin resistance NOT DETECTED NOT DETECTED Final   Streptococcus species NOT DETECTED NOT DETECTED Final   Streptococcus agalactiae NOT DETECTED NOT DETECTED Final   Streptococcus pneumoniae NOT DETECTED NOT DETECTED Final   Streptococcus pyogenes NOT DETECTED  NOT DETECTED Final   Acinetobacter baumannii NOT DETECTED NOT DETECTED Final   Enterobacteriaceae species NOT DETECTED NOT DETECTED Final   Enterobacter cloacae complex NOT DETECTED NOT DETECTED Final   Escherichia coli NOT DETECTED NOT DETECTED Final   Klebsiella oxytoca NOT DETECTED NOT DETECTED Final   Klebsiella pneumoniae NOT DETECTED NOT DETECTED Final   Proteus species NOT DETECTED NOT DETECTED Final   Serratia marcescens NOT DETECTED NOT DETECTED Final   Haemophilus influenzae NOT DETECTED NOT DETECTED Final   Neisseria meningitidis NOT DETECTED NOT DETECTED Final   Pseudomonas aeruginosa NOT DETECTED NOT DETECTED Final   Candida albicans NOT DETECTED NOT DETECTED Final   Candida glabrata NOT DETECTED NOT DETECTED Final   Candida krusei NOT DETECTED NOT DETECTED Final   Candida parapsilosis NOT DETECTED NOT DETECTED Final   Candida tropicalis NOT DETECTED NOT DETECTED Final    Comment: Performed at [facility], [address], [place], [place] [address]  Culture, blood (Routine X 2) w Reflex to ID Panel     Status: Abnormal   Collection Time: 01/19/19  7:47 PM  Result Value Ref Range Status   Specimen Description   Final    BLOOD RIGHT HAND Performed at [facility], [address] [place], [place] [address]    Special Requests   Final    BOTTLES DRAWN AEROBIC ONLY Blood Culture adequate volume Performed at [facility], [address] [place], [place] [address]    Culture  Setup Time   Final    GRAM POSITIVE COCCI AEROBIC BOTTLE ONLY CRITICAL VALUE NOTED.  VALUE IS CONSISTENT WITH PREVIOUSLY REPORTED AND CALLED VALUE. GRAM STAIN REVIEWED-AGREE WITH RESULT T. TYSOR    Culture (A)  Final    STAPHYLOCOCCUS LUGDUNENSIS SUSCEPTIBILITIES PERFORMED ON PREVIOUS CULTURE WITHIN THE LAST 5 DAYS. Performed at [facility], [address] [place], [place] [address]    Report Status 01/23/2019 FINAL  Final  MRSA PCR Screening     Status: None   Collection Time:  01/20/19  3:35 AM  Result Value Ref Range Status   MRSA by PCR NEGATIVE NEGATIVE Final    Comment:        The GeneXpert MRSA Assay (FDA approved for NASAL specimens only), is one component of a comprehensive MRSA colonization surveillance program. It is not intended to diagnose MRSA infection nor to guide or monitor treatment for MRSA infections. Performed at [facility], [address], [place], [place] [address]   Culture, blood (  routine x 2)     Status: None (Preliminary result)   Collection Time: 01/23/19  8:07 AM  Result Value Ref Range Status   Specimen Description   Final    LEFT ANTECUBITAL BOTTLES DRAWN AEROBIC AND ANAEROBIC   Special Requests   Final    Blood Culture adequate volume Performed at [facility], [address] [place], [place] [address]    Culture PENDING  Incomplete   Report Status PENDING  Incomplete  Culture, blood (routine x 2)     Status: None (Preliminary result)   Collection Time: 01/23/19  8:15 AM  Result Value Ref Range Status   Specimen Description BLOOD LEFT ARM BOTTLES DRAWN AEROBIC AND ANAEROBIC  Final   Special Requests   Final    Blood Culture adequate volume Performed at [facility], [address] [place], [place] [address]    Culture PENDING  Incomplete   Report Status PENDING  Incomplete         Radiology Studies: No results found.      Scheduled Meds: . aspirin  81 mg Oral Daily  . feeding supplement (ENSURE ENLIVE)  237 mL Oral BID BM  . haloperidol lactate  2 mg Intravenous Once  . lisinopril  10 mg Oral Daily   And  . hydrochlorothiazide  12.5 mg Oral Daily  . loratadine  10 mg Oral Daily  . multivitamin  15 mL Oral Daily  . omega-3 acid ethyl esters  1 g Oral Daily  . potassium chloride  40 mEq Oral Once  . simvastatin  20 mg Oral q1800  . sodium chloride flush  3 mL Intravenous Q12H   Continuous Infusions: . sodium chloride    .  ceFAZolin (ANCEF) IV 2 g (01/23/19 0512)     LOS: 1 day    Time  spent: 30 minutes    Suriyah Vergara Hoover Brunette Shataya Winkles, DO [facility] Pager [phone]  If 7PM-7AM, please contact night-coverage [email or web address] Password [email or web address] 01/23/2019, 8:30 AM

## 2019-01-23 NOTE — Progress Notes (Signed)
*  PRELIMINARY RESULTS* Echocardiogram 2D Echocardiogram LIMITED has been performed.  Terry [facility] 01/23/2019, 11:09 AM

## 2019-01-24 LAB — CBC
HCT: 37.2 % — ABNORMAL LOW (ref 39.0–52.0)
Hemoglobin: 12.6 g/dL — ABNORMAL LOW (ref 13.0–17.0)
MCH: 34.9 pg — ABNORMAL HIGH (ref 26.0–34.0)
MCHC: 33.9 g/dL (ref 30.0–36.0)
MCV: 103 fL — ABNORMAL HIGH (ref 80.0–100.0)
Platelets: 137 10*3/uL — ABNORMAL LOW (ref 150–400)
RBC: 3.61 MIL/uL — ABNORMAL LOW (ref 4.22–5.81)
RDW: 12.8 % (ref 11.5–15.5)
WBC: 6.9 10*3/uL (ref 4.0–10.5)
nRBC: 0 % (ref 0.0–0.2)

## 2019-01-24 LAB — BASIC METABOLIC PANEL
Anion gap: 6 (ref 5–15)
BUN: 21 mg/dL (ref 8–23)
CO2: 29 mmol/L (ref 22–32)
Calcium: 9.1 mg/dL (ref 8.9–10.3)
Chloride: 103 mmol/L (ref 98–111)
Creatinine, Ser: 1 mg/dL (ref 0.61–1.24)
GFR calc Af Amer: >60 mL/min (ref >60)
GFR calc non Af Amer: >60 mL/min (ref >60)
Glucose, Bld: 99 mg/dL (ref 70–99)
Potassium: 3.9 mmol/L (ref 3.5–5.1)
Sodium: 138 mmol/L (ref 135–145)

## 2019-01-24 NOTE — Clinical Social Work Note (Signed)
Patient's daughter, Mrs. Hutcherson, informed of patient's need for 6 weeks IV antibiotics. She is agreeable to [facility] services. HH choices were discussed and [facility] was chosen.   Linda at [facility] was provided the referral.    Annice Needy, LCSW

## 2019-01-24 NOTE — Care Management Important Message (Signed)
Important Message  Patient Details  Name: Terry [facility] MRN: [id number] Date of Birth: 1927-12-10   Medicare Important Message Given:  Yes    Corey Harold 01/24/2019, 3:49 PM

## 2019-01-24 NOTE — Progress Notes (Signed)
PROGRESS NOTE    Terry Barrett  ZOX:[id number] DOB: 08/14/1928 DOA: 01/22/2019 PCP: Theodoro Kos, MD   Brief Narrative:  Terry Barrett a 85 y.o.malewith medical history significant forcoronary artery disease status post three-vessel CABGin2010, aortic stenosis status post bioprosthetic aortic valve replacement, chronic diastolic heart failure, hypertension, [facility] acute metabolic encephalopathy in the setting of suspected bibasilar pneumonia after [facility].  He was treated with Rocephin and azithromycin and subsequently discharged on 4/10 in stable condition with plans to continue the current antibiotics. His mentation had significantly improved. Unfortunately, blood cultures have resulted on 4/11 with 2 sets positive for staphylococcal bacteremia for which patient was directly admitted back to the hospital in order to initiate treatment with IV Ancef and perform TEE for further evaluation of his bioprosthetic aortic valve. He has actually remained in stable condition and per his wife and the patient, he has been doing quite well at home.  Assessment & Plan:   Active Problems:   Bacteremia  1. Staphylococcal bacteremia.   Continue IV Ancef and follow labs.  Repeat blood cultures are negative and less than 24 hours.  If they remain negative by tomorrow we will anticipate placing a PICC line was discharged with home IV antibiotics.    Transthoracic echocardiogram with no signs of vegetations.  Cardiology cannot perform TEE unless urgent or emergent at this point in time due to issues with COVID-19. 2. CKD. Will monitor with a.m. labs. 3. Chronic diastolic heart failure. No need for IV fluid noted at this time. Will monitor with daily weights. 4. Essential hypertension. Continue home medication and monitor blood pressure readings. 5. Dyslipidemia. Continue simvastatin.  6. Dementia with agitation.  Haldol added as needed.   DVT prophylaxis:SCDs Code Status:Full Family Communication:Spoke with wife on phone Disposition Plan: Continue IV Ancef through today and follow blood cultures.  If negative by tomorrow we will anticipate placing of PICC line and discharge to home with antibiotics for 6 weeks.   Consultants:  None  Procedures:  None  Antimicrobials:  IV Ancef 4/11->  Subjective: Patient seen and evaluated today with no new acute complaints or concerns. No acute concerns or events noted overnight.  He is resting comfortably this morning.  Objective: Vitals:   01/22/19 2117 01/23/19 1300 01/23/19 2105 01/24/19 0514  BP: (!) 146/67 135/71 132/60 126/65  Pulse: 62 72 64 62  Resp: Temp:  98.1 F (36.7 C) 99 F (37.2 C) 98.9 F (37.2 C)  TempSrc:  Oral Oral Oral  SpO2: 100% 99% 99% 99%  Weight:    64.3 kg  Height:        Intake/Output Summary (Last 24 hours) at 01/24/2019 0809 Last data filed at 01/24/2019 0617 Gross per 24 hour  Intake 1115.94 ml  Output 3150 ml  Net -2034.06 ml   Filed Weights   01/22/19 1540 01/24/19 0514  Weight: 61.7 kg 64.3 kg    Examination:  General exam: Appears calm and comfortable, somnolent Respiratory system: Clear to auscultation. Respiratory effort normal. Cardiovascular system: S1 & S2 heard, RRR. No JVD, murmurs, rubs, gallops or clicks. No pedal edema. Gastrointestinal system: Abdomen is nondistended, soft and nontender. No organomegaly or masses felt. Normal bowel sounds heard. Central nervous system: Somnolent Extremities: Symmetric 5 x 5 power. Skin: No rashes, lesions or ulcers Psychiatry: Cannot be evaluated at this time.    Data Reviewed: I have  personally reviewed following labs and imaging studies  CBC: Recent Labs  Lab 01/19/19 1938 01/20/19 0004 01/21/19 0435 01/23/19 0441 01/24/19 0522  WBC 5.7 5.7 5.2 5.3 6.9  NEUTROABS 4.2 3.9  --   --    --   HGB 12.1* 12.4* 12.1* 11.7* 12.6*  HCT 35.4* 37.3* 35.4* 34.5* 37.2*  MCV 105.7* 108.4* 103.8* 103.0* 103.0*  PLT 107* 97* 88* 115* 137*   Basic Metabolic Panel: Recent Labs  Lab 01/19/19 1938 01/20/19 0510 01/21/19 0435 01/23/19 0441 01/24/19 0522  NA 135 139 139 139 138  K 3.9 3.8 3.5 3.4* 3.9  CL 104 104 109 104 103  CO2 23 25 24 27 29   GLUCOSE 114* 105* 92 94 99  BUN 40* 33* 25* 24* 21  CREATININE 1.66* 1.32* 0.93 0.99 1.00  CALCIUM 8.1* 8.3* 8.1* 8.7* 9.1  MG  --  1.7  --   --   --    GFR: Estimated Creatinine Clearance: 45.5 mL/min (by C-G formula based on SCr of 1 mg/dL). Liver Function Tests: Recent Labs  Lab 01/19/19 1938 01/20/19 0510  AST 43* 42*  ALT 30 30  ALKPHOS 73 70  BILITOT 0.9 1.0  PROT 6.0* 5.7*  ALBUMIN 3.6 3.3*   No results for input(s): LIPASE, AMYLASE in the last 168 hours. No results for input(s): AMMONIA in the last 168 hours. Coagulation Profile: No results for input(s): INR, PROTIME in the last 168 hours. Cardiac Enzymes: Recent Labs  Lab 01/19/19 1938 01/19/19 1947  CKTOTAL  --  127  TROPONINI <0.03  --    BNP (last 3 results) No results for input(s): PROBNP in the last 8760 hours. HbA1C: No results for input(s): HGBA1C in the last 72 hours. CBG: No results for input(s): GLUCAP in the last 168 hours. Lipid Profile: No results for input(s): CHOL, HDL, LDLCALC, TRIG, CHOLHDL, LDLDIRECT in the last 72 hours. Thyroid Function Tests: No results for input(s): TSH, T4TOTAL, FREET4, T3FREE, THYROIDAB in the last 72 hours. Anemia Panel: No results for input(s): VITAMINB12, FOLATE, FERRITIN, TIBC, IRON, RETICCTPCT in the last 72 hours. Sepsis Labs: Recent Labs  Lab 01/19/19 1938  LATICACIDVEN 1.0    Recent Results (from the past 240 hour(s))  Culture, blood (Routine X 2) w Reflex to ID Panel     Status: Abnormal   Collection Time: 01/19/19  7:38 PM  Result Value Ref Range Status   Specimen Description   Final    BLOOD  RIGHT ARM Performed at [facility], [address], [place], [place] [address]    Special Requests   Final    BOTTLES DRAWN AEROBIC AND ANAEROBIC Blood Culture adequate volume Performed at [facility], [address], [place], [place] [address]    Culture  Setup Time   Final    GRAM POSITIVE COCCI Gram Stain Report Called to,Read Back By and Verified With: DARLING J. AT 1432 ON [id number] BY THOMPSON S. Organism ID to follow CRITICAL RESULT CALLED TO, READ BACK BY AND VERIFIED WITH: T HANDY RN 1929 01/20/19 A BROWNING Performed at [facility], [address], [place], [place] [address]    Culture STAPHYLOCOCCUS LUGDUNENSIS (A)  Final   Report Status 01/23/2019 FINAL  Final   Organism ID, Bacteria STAPHYLOCOCCUS LUGDUNENSIS  Final      Susceptibility   Staphylococcus lugdunensis - MIC*    CIPROFLOXACIN <=0.5 SENSITIVE Sensitive     ERYTHROMYCIN <=0.25 SENSITIVE Sensitive     GENTAMICIN <=0.5 SENSITIVE Sensitive  OXACILLIN 2 SENSITIVE Sensitive     TETRACYCLINE <=1 SENSITIVE Sensitive     VANCOMYCIN <=0.5 SENSITIVE Sensitive     TRIMETH/SULFA <=10 SENSITIVE Sensitive     CLINDAMYCIN <=0.25 SENSITIVE Sensitive     RIFAMPIN <=0.5 SENSITIVE Sensitive     Inducible Clindamycin NEGATIVE Sensitive     * STAPHYLOCOCCUS LUGDUNENSIS  Blood Culture ID Panel (Reflexed)     Status: Abnormal   Collection Time: 01/19/19  7:38 PM  Result Value Ref Range Status   Enterococcus species NOT DETECTED NOT DETECTED Final   Listeria monocytogenes NOT DETECTED NOT DETECTED Final   Staphylococcus species DETECTED (A) NOT DETECTED Final    Comment: Methicillin (oxacillin) susceptible coagulase negative staphylococcus. Possible blood culture contaminant (unless isolated from more than one blood culture draw or clinical case suggests pathogenicity). No antibiotic treatment is indicated for blood  culture contaminants. CRITICAL RESULT CALLED TO, READ BACK BY AND VERIFIED WITH: T HANDY RN 1929 01/20/19 A  BROWNING    Staphylococcus aureus (BCID) NOT DETECTED NOT DETECTED Final   Methicillin resistance NOT DETECTED NOT DETECTED Final   Streptococcus species NOT DETECTED NOT DETECTED Final   Streptococcus agalactiae NOT DETECTED NOT DETECTED Final   Streptococcus pneumoniae NOT DETECTED NOT DETECTED Final   Streptococcus pyogenes NOT DETECTED NOT DETECTED Final   Acinetobacter baumannii NOT DETECTED NOT DETECTED Final   Enterobacteriaceae species NOT DETECTED NOT DETECTED Final   Enterobacter cloacae complex NOT DETECTED NOT DETECTED Final   Escherichia coli NOT DETECTED NOT DETECTED Final   Klebsiella oxytoca NOT DETECTED NOT DETECTED Final   Klebsiella pneumoniae NOT DETECTED NOT DETECTED Final   Proteus species NOT DETECTED NOT DETECTED Final   Serratia marcescens NOT DETECTED NOT DETECTED Final   Haemophilus influenzae NOT DETECTED NOT DETECTED Final   Neisseria meningitidis NOT DETECTED NOT DETECTED Final   Pseudomonas aeruginosa NOT DETECTED NOT DETECTED Final   Candida albicans NOT DETECTED NOT DETECTED Final   Candida glabrata NOT DETECTED NOT DETECTED Final   Candida krusei NOT DETECTED NOT DETECTED Final   Candida parapsilosis NOT DETECTED NOT DETECTED Final   Candida tropicalis NOT DETECTED NOT DETECTED Final    Comment: Performed at [facility], [address], [place], [place] [address]  Culture, blood (Routine X 2) w Reflex to ID Panel     Status: Abnormal   Collection Time: 01/19/19  7:47 PM  Result Value Ref Range Status   Specimen Description   Final    BLOOD RIGHT HAND Performed at [facility], [address] [place], [place] [address]    Special Requests   Final    BOTTLES DRAWN AEROBIC ONLY Blood Culture adequate volume Performed at [facility], [address] [place], [place] [address]    Culture  Setup Time   Final    GRAM POSITIVE COCCI AEROBIC BOTTLE ONLY CRITICAL VALUE NOTED.  VALUE IS CONSISTENT WITH PREVIOUSLY REPORTED AND CALLED VALUE. GRAM  STAIN REVIEWED-AGREE WITH RESULT T. TYSOR    Culture (A)  Final    STAPHYLOCOCCUS LUGDUNENSIS SUSCEPTIBILITIES PERFORMED ON PREVIOUS CULTURE WITHIN THE LAST 5 DAYS. Performed at [facility], [address], [place], [place] [address]    Report Status 01/23/2019 FINAL  Final  MRSA PCR Screening     Status: None   Collection Time: 01/20/19  3:35 AM  Result Value Ref Range Status   MRSA by PCR NEGATIVE NEGATIVE Final    Comment:        The GeneXpert  MRSA Assay (FDA approved for NASAL specimens only), is one component of a comprehensive MRSA colonization surveillance program. It is not intended to diagnose MRSA infection nor to guide or monitor treatment for MRSA infections. Performed at [facility], [address] [place], [place] [address]   Culture, blood (routine x 2)     Status: None (Preliminary result)   Collection Time: 01/23/19  8:07 AM  Result Value Ref Range Status   Specimen Description   Final    LEFT ANTECUBITAL BOTTLES DRAWN AEROBIC AND ANAEROBIC   Special Requests Blood Culture adequate volume  Final   Culture   Final    NO GROWTH < 24 HOURS Performed at [facility], [address] [place], [place] [address]    Report Status PENDING  Incomplete  Culture, blood (routine x 2)     Status: None (Preliminary result)   Collection Time: 01/23/19  8:15 AM  Result Value Ref Range Status   Specimen Description BLOOD LEFT ARM BOTTLES DRAWN AEROBIC AND ANAEROBIC  Final   Special Requests Blood Culture adequate volume  Final   Culture   Final    NO GROWTH < 24 HOURS Performed at [facility], [address] [place], [place] [address]    Report Status PENDING  Incomplete         Radiology Studies: No results found.      Scheduled Meds: . aspirin  81 mg Oral Daily  . feeding supplement (ENSURE ENLIVE)  237 mL Oral BID BM  . haloperidol lactate  2 mg Intravenous Once  . lisinopril  10 mg Oral Daily   And  . hydrochlorothiazide  12.5 mg Oral Daily   . loratadine  10 mg Oral Daily  . multivitamin  15 mL Oral Daily  . omega-3 acid ethyl esters  1 g Oral Daily  . simvastatin  20 mg Oral q1800  . sodium chloride flush  3 mL Intravenous Q12H   Continuous Infusions: . sodium chloride    .  ceFAZolin (ANCEF) IV Stopped (01/24/19 0559)     LOS: 2 days    Time spent: 30 minutes    Terry Bourcier Hoover Brunette Fabiola Mudgett, DO [facility] Pager [phone]  If 7PM-7AM, please contact night-coverage [email or web address] Password [email or web address] 01/24/2019, 8:09 AM

## 2019-01-25 ENCOUNTER — Inpatient Hospital Stay: Payer: Self-pay

## 2019-01-25 LAB — CBC
HCT: 39.2 % (ref 39.0–52.0)
Hemoglobin: 13.1 g/dL (ref 13.0–17.0)
MCH: 34.9 pg — ABNORMAL HIGH (ref 26.0–34.0)
MCHC: 33.4 g/dL (ref 30.0–36.0)
MCV: 104.5 fL — ABNORMAL HIGH (ref 80.0–100.0)
Platelets: 159 10*3/uL (ref 150–400)
RBC: 3.75 MIL/uL — ABNORMAL LOW (ref 4.22–5.81)
RDW: 12.9 % (ref 11.5–15.5)
WBC: 7.3 10*3/uL (ref 4.0–10.5)
nRBC: 0 % (ref 0.0–0.2)

## 2019-01-25 LAB — BASIC METABOLIC PANEL
Anion gap: 9 (ref 5–15)
BUN: 25 mg/dL — ABNORMAL HIGH (ref 8–23)
CO2: 28 mmol/L (ref 22–32)
Calcium: 9.1 mg/dL (ref 8.9–10.3)
Chloride: 101 mmol/L (ref 98–111)
Creatinine, Ser: 1.05 mg/dL (ref 0.61–1.24)
GFR calc Af Amer: >60 mL/min (ref >60)
GFR calc non Af Amer: >60 mL/min (ref >60)
Glucose, Bld: 96 mg/dL (ref 70–99)
Potassium: 4.1 mmol/L (ref 3.5–5.1)
Sodium: 138 mmol/L (ref 135–145)

## 2019-01-25 MED ORDER — SODIUM CHLORIDE 0.9% FLUSH: 10.0000 mL | INTRAVENOUS | Status: DC | PRN

## 2019-01-25 MED ORDER — CEFAZOLIN IV (FOR PTA / DISCHARGE USE ONLY): 2.0000 g | Freq: Three times a day (TID) | INTRAVENOUS | 0 refills | Status: AC

## 2019-01-25 MED ORDER — SODIUM CHLORIDE 0.9% FLUSH: 10.0000 mL | Freq: Two times a day (BID) | INTRAVENOUS | Status: DC

## 2019-01-25 NOTE — Discharge Summary (Signed)
Physician Discharge Summary  Terry Barrett TLX:[id number] DOB: 1928/08/30 DOA: 01/22/2019  PCP: Galen Manila, MD  Admit date: 01/22/2019  Discharge date: 01/25/2019  Admitted From:Home  Disposition:  Home  Recommendations for Outpatient Follow-up:  1. Follow up with PCP in 4 weeks 2. Continue on IV Ancef as scheduled through 03/08/2019  Home Health: Yes with RN  Equipment/Devices: None  Discharge Condition: Stable  CODE STATUS: Full  Diet recommendation: Heart Healthy  Brief/Interim Summary: Per HPI: 85 F [name] a 85 y.o.malewith medical history significant forcoronary artery disease status post three-vessel CABGin2010, aortic stenosis status post bioprosthetic aortic valve replacement, chronic diastolic heart failure, hypertension, [facility] encephalopathy in the setting of suspected bibasilar pneumonia after presenting from home to the[facility] EDfor evaluation of confusion.  He was treated with Rocephin and azithromycin and subsequently discharged on 4/10 in stable condition with plans to continue the current antibiotics. His mentation had significantly improved. Unfortunately, blood cultures have resulted on 4/11 with 2 sets positive for staphylococcal bacteremia for which patient was directly admitted back to the hospital in order to initiate treatment with IV Ancef.  He has tolerated this well and repeat blood cultures are now negative.  Transthoracic echocardiogram performed with no signs of bacterial vegetations noted and unfortunately, TEE could not be performed on account of restrictions during this COVID-19 crisis.  Therefore, on account of patient's bioprosthetic aortic valve, patient will be discharged with recommendations to continue home IV Ancef after PICC line placement for total of 6 weeks.  His antibiotic treatment will continue for duration of 6 weeks and it has been recommended that he  follow-up with his PCP in the near future.  Continue other home medications as prescribed.  Discharge Diagnoses:  Active Problems:   Bacteremia  Principal discharge diagnosis: Staph bacteremia in the setting of bioprosthetic aortic valve.  Discharge Instructions  Discharge Instructions    Diet - low sodium heart healthy   Complete by:  As directed    Home infusion instructions [facility] May follow [place] Dosing Protocol; May administer Cathflo as needed to maintain patency of vascular access device.; Flushing of vascular access device: per [facility] Protocol: 0.9% NaCl pre/post medica...   Complete by:  As directed    Instructions:  May follow [place] Dosing Protocol   Instructions:  May administer Cathflo as needed to maintain patency of vascular access device.   Instructions:  Flushing of vascular access device: per [facility] Protocol: 0.9% NaCl pre/post medication administration and prn patency; Heparin 100 u/ml, 77m for implanted ports and Heparin 10u/ml, 566mfor all other central venous catheters.   Instructions:  May follow [facility] Anaphylaxis Protocol for First Dose Administration in the home: 0.9% NaCl at 25-50 ml/hr to maintain IV access for protocol meds. Epinephrine 0.3 ml IV/IM PRN and Benadryl 25-50 IV/IM PRN s/s of anaphylaxis.   Instructions:  [facility] Coordinator (RN) to assist per patient IV care needs in the home PRN.   Increase activity slowly   Complete by:  As directed      Allergies as of 01/25/2019      Reactions   Sulfamethoxazole-trimethoprim Itching      Medication List    STOP taking these medications   azithromycin 500 MG tablet Commonly known as:  Zithromax   cefdinir 300 MG capsule Commonly known as:  OMNICEF     TAKE these medications   aspirin 81 MG tablet Take 1  tablet (81 mg total) by mouth daily.   ceFAZolin  IVPB Commonly known as:  ANCEF Inject 2 g into the vein every 8 (eight) hours. Indication:  bacteremia Last  Day of Therapy:  03/08/19 Labs - Once weekly:  CBC/D and BMP, Labs - Every other week:  ESR and CRP   cetirizine 10 MG tablet Commonly known as:  ZYRTEC Take 10 mg by mouth daily.   dextromethorphan-guaiFENesin 30-600 MG 12hr tablet Commonly known as:  MUCINEX DM Take 1 tablet by mouth 2 (two) times daily for 5 days.   docusate sodium 100 MG capsule Commonly known as:  COLACE Take 100 mg by mouth daily as needed for mild constipation.   feeding supplement (ENSURE ENLIVE) Liqd Take 237 mLs by mouth 2 (two) times daily between meals.   Fish Oil 1200 MG Caps Take 1,200 mg by mouth daily.   lisinopril-hydrochlorothiazide 10-12.5 MG tablet Commonly known as:  PRINZIDE,ZESTORETIC TAKE ONE (1) TABLET EACH DAY   multivitamin-iron-minerals-folic acid chewable tablet Chew 1 tablet by mouth daily.   Nitrostat 0.4 MG SL tablet Generic drug:  nitroGLYCERIN DISSOLVE 1 TAB UNDER TOUNGE FOR CHEST PAIN. MAY REPEAT EVERY 5 MINUTES FOR 3 DOSES. IF NO RELIEF CALL 911 OR GO TO ER   PROBIOTIC PO Take by mouth.   simvastatin 40 MG tablet Commonly known as:  ZOCOR Take 20 mg by mouth daily.            Home Infusion Instuctions  (From admission, onward)         Start     Ordered   01/25/19 0000  Home infusion instructions [facility] May follow [place] Dosing Protocol; May administer Cathflo as needed to maintain patency of vascular access device.; Flushing of vascular access device: per [facility] Protocol: 0.9% NaCl pre/post medica...    Question Answer Comment  Instructions May follow [place] Dosing Protocol   Instructions May administer Cathflo as needed to maintain patency of vascular access device.   Instructions Flushing of vascular access device: per [facility] Protocol: 0.9% NaCl pre/post medication administration and prn patency; Heparin 100 u/ml, 88m for implanted ports and Heparin 10u/ml, 592mfor all other central venous catheters.   Instructions May follow [facility]  Anaphylaxis Protocol for First Dose Administration in the home: 0.9% NaCl at 25-50 ml/hr to maintain IV access for protocol meds. Epinephrine 0.3 ml IV/IM PRN and Benadryl 25-50 IV/IM PRN s/s of anaphylaxis.   Instructions [facility] Infusion Coordinator (RN) to assist per patient IV care needs in the home PRN.      01/25/19 1233         Follow-up Information    LeGalen ManilaMD Follow up in 4 week(s).   Specialty:  Internal Medicine Contact information: [phone]        Allergies  Allergen Reactions  . Sulfamethoxazole-Trimethoprim Itching    Consultations:  Cardiology   Procedures/Studies: Dg Chest Port 1 View  Result Date: 01/19/2019 CLINICAL DATA:  Altered mental status. Weakness. EXAM: PORTABLE CHEST 1 VIEW COMPARISON:  Radiograph 03/22/2015 FINDINGS: Low lung volumes. Post median sternotomy. Unchanged heart size and mediastinal contours with aortic atherosclerosis and tortuosity. Ill-defined bibasilar opacities. No pulmonary edema. No pneumothorax or large pleural effusion. Chronic degenerative change of both shoulders, right greater than left. IMPRESSION: 1. Low lung volumes with patchy bibasilar opacities favoring atelectasis. 2. Unchanged aortic tortuosity. Aortic Atherosclerosis (ICD10-I70.0). Electronically Signed   By: MeAurther Loft.  On: 01/19/2019 19:39   [place] Ekg Site Rite  Result Date: 01/25/2019 If Site Rite image not attached, placement could not be confirmed due to current cardiac rhythm.    Discharge Exam: Vitals:   01/24/19 2200 01/25/19 0551  BP: (!) 146/71 104/64  Pulse: 77 76  Resp:    Temp: 98.7 F (37.1 C) 98.4 F (36.9 C)  SpO2: 98% 97%   Vitals:   01/23/19 2105 01/24/19 0514 01/24/19 2200 01/25/19 0551  BP: 132/60 126/65 (!) 146/71 104/64  Pulse: 64 62 77 76  Resp: 16 16    Temp: 99 F (37.2 C) 98.9 F (37.2 C) 98.7 F (37.1 C) 98.4 F (36.9 C)  TempSrc: Oral Oral  Oral Oral  SpO2: 99% 99% 98% 97%  Weight:  64.3 kg  66 kg  Height:        General: Pt is alert, awake, not in acute distress Cardiovascular: RRR, S1/S2 +, no rubs, no gallops Respiratory: CTA bilaterally, no wheezing, no rhonchi Abdominal: Soft, NT, ND, bowel sounds + Extremities: no edema, no cyanosis    The results of significant diagnostics from this hospitalization (including imaging, microbiology, ancillary and laboratory) are listed below for reference.     Microbiology: Recent Results (from the past 240 hour(s))  Culture, blood (Routine X 2) w Reflex to ID Panel     Status: Abnormal   Collection Time: 01/19/19  7:38 PM  Result Value Ref Range Status   Specimen Description   Final    BLOOD RIGHT ARM Performed at [facility], [address], [place], [place] [address]    Special Requests   Final    BOTTLES DRAWN AEROBIC AND ANAEROBIC Blood Culture adequate volume Performed at [facility], [address], [place], [place] [address]    Culture  Setup Time   Final    GRAM POSITIVE COCCI Gram Stain Report Called to,Read Back By and Verified With: DARLING J. AT [id number] ON [id number] BY THOMPSON S. Organism ID to follow CRITICAL RESULT CALLED TO, READ BACK BY AND VERIFIED WITH: T HANDY RN 1929 01/20/19 A BROWNING Performed at [facility], [place] [address], [place],  [address]    Culture STAPHYLOCOCCUS LUGDUNENSIS (A)  Final   Report Status 01/23/2019 FINAL  Final   Organism ID, Bacteria STAPHYLOCOCCUS LUGDUNENSIS  Final      Susceptibility   Staphylococcus lugdunensis - MIC*    CIPROFLOXACIN <=0.5 SENSITIVE Sensitive     ERYTHROMYCIN <=0.25 SENSITIVE Sensitive     GENTAMICIN <=0.5 SENSITIVE Sensitive     OXACILLIN 2 SENSITIVE Sensitive     TETRACYCLINE <=1 SENSITIVE Sensitive     VANCOMYCIN <=0.5 SENSITIVE Sensitive     TRIMETH/SULFA <=10 SENSITIVE Sensitive     CLINDAMYCIN <=0.25 SENSITIVE Sensitive     RIFAMPIN <=0.5 SENSITIVE Sensitive     Inducible Clindamycin  NEGATIVE Sensitive     * STAPHYLOCOCCUS LUGDUNENSIS  Blood Culture ID Panel (Reflexed)     Status: Abnormal   Collection Time: 01/19/19  7:38 PM  Result Value Ref Range Status   Enterococcus species NOT DETECTED NOT DETECTED Final   Listeria monocytogenes NOT DETECTED NOT DETECTED Final   Staphylococcus species DETECTED (A) NOT DETECTED Final    Comment: Methicillin (oxacillin) susceptible coagulase negative staphylococcus. Possible blood culture contaminant (unless isolated from more than one blood culture draw or clinical case suggests pathogenicity). No antibiotic treatment is indicated for blood  culture contaminants. CRITICAL RESULT CALLED TO, READ BACK BY AND VERIFIED WITH: T  HANDY RN 1929 01/20/19 A BROWNING    Staphylococcus aureus (BCID) NOT DETECTED NOT DETECTED Final   Methicillin resistance NOT DETECTED NOT DETECTED Final   Streptococcus species NOT DETECTED NOT DETECTED Final   Streptococcus agalactiae NOT DETECTED NOT DETECTED Final   Streptococcus pneumoniae NOT DETECTED NOT DETECTED Final   Streptococcus pyogenes NOT DETECTED NOT DETECTED Final   Acinetobacter baumannii NOT DETECTED NOT DETECTED Final   Enterobacteriaceae species NOT DETECTED NOT DETECTED Final   Enterobacter cloacae complex NOT DETECTED NOT DETECTED Final   Escherichia coli NOT DETECTED NOT DETECTED Final   Klebsiella oxytoca NOT DETECTED NOT DETECTED Final   Klebsiella pneumoniae NOT DETECTED NOT DETECTED Final   Proteus species NOT DETECTED NOT DETECTED Final   Serratia marcescens NOT DETECTED NOT DETECTED Final   Haemophilus influenzae NOT DETECTED NOT DETECTED Final   Neisseria meningitidis NOT DETECTED NOT DETECTED Final   Pseudomonas aeruginosa NOT DETECTED NOT DETECTED Final   Candida albicans NOT DETECTED NOT DETECTED Final   Candida glabrata NOT DETECTED NOT DETECTED Final   Candida krusei NOT DETECTED NOT DETECTED Final   Candida parapsilosis NOT DETECTED NOT DETECTED Final   Candida  tropicalis NOT DETECTED NOT DETECTED Final    Comment: Performed at [facility], [place] [address] [place], [place] [address]  Culture, blood (Routine X 2) w Reflex to ID Panel     Status: Abnormal   Collection Time: 01/19/19  7:47 PM  Result Value Ref Range Status   Specimen Description   Final    BLOOD RIGHT HAND Performed at [facility], [address], [place], [place] [address]    Special Requests   Final    BOTTLES DRAWN AEROBIC ONLY Blood Culture adequate volume Performed at [facility], [address] [place], [place] [address]    Culture  Setup Time   Final    GRAM POSITIVE COCCI AEROBIC BOTTLE ONLY CRITICAL VALUE NOTED.  VALUE IS CONSISTENT WITH PREVIOUSLY REPORTED AND CALLED VALUE. GRAM STAIN REVIEWED-AGREE WITH RESULT T. TYSOR    Culture (A)  Final    STAPHYLOCOCCUS LUGDUNENSIS SUSCEPTIBILITIES PERFORMED ON PREVIOUS CULTURE WITHIN THE LAST 5 DAYS. Performed at [facility], [place] [address] [place], [place] [address]    Report Status 01/23/2019 FINAL  Final  MRSA PCR Screening     Status: None   Collection Time: 01/20/19  3:35 AM  Result Value Ref Range Status   MRSA by PCR NEGATIVE NEGATIVE Final    Comment:        The GeneXpert MRSA Assay (FDA approved for NASAL specimens only), is one component of a comprehensive MRSA colonization surveillance program. It is not intended to diagnose MRSA infection nor to guide or monitor treatment for MRSA infections. Performed at [facility], [address], [place], [place] [address]   Culture, blood (routine x 2)     Status: None (Preliminary result)   Collection Time: 01/23/19  8:07 AM  Result Value Ref Range Status   Specimen Description   Final    LEFT ANTECUBITAL BOTTLES DRAWN AEROBIC AND ANAEROBIC   Special Requests Blood Culture adequate volume  Final   Culture   Final    NO GROWTH 2 DAYS Performed at [facility], [address], [place], [place] [address]    Report Status PENDING  Incomplete   Culture, blood (routine x 2)     Status: None (Preliminary result)   Collection Time: 01/23/19  8:15 AM  Result Value Ref Range  Status   Specimen Description BLOOD LEFT ARM BOTTLES DRAWN AEROBIC AND ANAEROBIC  Final   Special Requests Blood Culture adequate volume  Final   Culture   Final    NO GROWTH 2 DAYS Performed at [facility], [address] [place], [place] [address]    Report Status PENDING  Incomplete     Labs: BNP (last 3 results) No results for input(s): BNP in the last 8760 hours. Basic Metabolic Panel: Recent Labs  Lab 01/20/19 0510 01/21/19 0435 01/23/19 0441 01/24/19 0522 01/25/19 0513  NA 139 139 139 138 138  K 3.8 3.5 3.4* 3.9 4.1  CL 104 109 104 103 101  CO2 25 24 27 29 28   GLUCOSE 105* 92 94 99 96  BUN 33* 25* 24* 21 25*  CREATININE 1.32* 0.93 0.99 1.00 1.05  CALCIUM 8.3* 8.1* 8.7* 9.1 9.1  MG 1.7  --   --   --   --    Liver Function Tests: Recent Labs  Lab 01/19/19 1938 01/20/19 0510  AST 43* 42*  ALT 30 30  ALKPHOS 73 70  BILITOT 0.9 1.0  PROT 6.0* 5.7*  ALBUMIN 3.6 3.3*   No results for input(s): LIPASE, AMYLASE in the last 168 hours. No results for input(s): AMMONIA in the last 168 hours. CBC: Recent Labs  Lab 01/19/19 1938 01/20/19 0004 01/21/19 0435 01/23/19 0441 01/24/19 0522 01/25/19 0513  WBC 5.7 5.7 5.2 5.3 6.9 7.3  NEUTROABS 4.2 3.9  --   --   --   --   HGB 12.1* 12.4* 12.1* 11.7* 12.6* 13.1  HCT 35.4* 37.3* 35.4* 34.5* 37.2* 39.2  MCV 105.7* 108.4* 103.8* 103.0* 103.0* 104.5*  PLT 107* 97* 88* 115* 137* 159   Cardiac Enzymes: Recent Labs  Lab 01/19/19 1938 01/19/19 1947  CKTOTAL  --  127  TROPONINI <0.03  --    BNP: Invalid input(s): POCBNP CBG: No results for input(s): GLUCAP in the last 168 hours. D-Dimer No results for input(s): DDIMER in the last 72 hours. Hgb A1c No results for input(s): HGBA1C in the last 72 hours. Lipid Profile No results for input(s): CHOL, HDL, LDLCALC, TRIG, CHOLHDL, LDLDIRECT  in the last 72 hours. Thyroid function studies No results for input(s): TSH, T4TOTAL, T3FREE, THYROIDAB in the last 72 hours.  Invalid input(s): FREET3 Anemia work up No results for input(s): VITAMINB12, FOLATE, FERRITIN, TIBC, IRON, RETICCTPCT in the last 72 hours. Urinalysis    Component Value Date/Time   COLORURINE YELLOW 01/19/2019 1950   APPEARANCEUR HAZY (A) 01/19/2019 1950   LABSPEC 1.019 01/19/2019 1950   PHURINE 5.0 01/19/2019 1950   GLUCOSEU NEGATIVE 01/19/2019 1950   HGBUR SMALL (A) 01/19/2019 1950   BILIRUBINUR NEGATIVE 01/19/2019 [place] NEGATIVE 01/19/2019 1950   PROTEINUR NEGATIVE 01/19/2019 1950   UROBILINOGEN 1.0 03/22/2015 1903   NITRITE NEGATIVE 01/19/2019 1950   LEUKOCYTESUR NEGATIVE 01/19/2019 1950   Sepsis Labs Invalid input(s): PROCALCITONIN,  WBC,  LACTICIDVEN Microbiology Recent Results (from the past 240 hour(s))  Culture, blood (Routine X 2) w Reflex to ID Panel     Status: Abnormal   Collection Time: 01/19/19  7:38 PM  Result Value Ref Range Status   Specimen Description   Final    BLOOD RIGHT ARM Performed at [facility], [address], [place], [place] [address]    Special Requests   Final    BOTTLES DRAWN AEROBIC AND ANAEROBIC Blood Culture adequate volume Performed at [facility], [address]  [place], [place] [address]    Culture  Setup Time   Final    GRAM POSITIVE COCCI Gram Stain Report Called to,Read Back By and Verified With: [place] [id number] ON [id number] BY THOMPSON S. Organism ID to follow CRITICAL RESULT CALLED TO, READ BACK BY AND VERIFIED WITH: T HANDY RN 1929 01/20/19 A BROWNING Performed at [facility], [place] [address], [place], [place] [address]    Culture STAPHYLOCOCCUS LUGDUNENSIS (A)  Final   Report Status 01/23/2019 FINAL  Final   Organism ID, Bacteria STAPHYLOCOCCUS LUGDUNENSIS  Final      Susceptibility   Staphylococcus lugdunensis - MIC*    CIPROFLOXACIN <=0.5 SENSITIVE Sensitive     ERYTHROMYCIN  <=0.25 SENSITIVE Sensitive     GENTAMICIN <=0.5 SENSITIVE Sensitive     OXACILLIN 2 SENSITIVE Sensitive     TETRACYCLINE <=1 SENSITIVE Sensitive     VANCOMYCIN <=0.5 SENSITIVE Sensitive     TRIMETH/SULFA <=10 SENSITIVE Sensitive     CLINDAMYCIN <=0.25 SENSITIVE Sensitive     RIFAMPIN <=0.5 SENSITIVE Sensitive     Inducible Clindamycin NEGATIVE Sensitive     * STAPHYLOCOCCUS LUGDUNENSIS  Blood Culture ID Panel (Reflexed)     Status: Abnormal   Collection Time: 01/19/19  7:38 PM  Result Value Ref Range Status   Enterococcus species NOT DETECTED NOT DETECTED Final   Listeria monocytogenes NOT DETECTED NOT DETECTED Final   Staphylococcus species DETECTED (A) NOT DETECTED Final    Comment: Methicillin (oxacillin) susceptible coagulase negative staphylococcus. Possible blood culture contaminant (unless isolated from more than one blood culture draw or clinical case suggests pathogenicity). No antibiotic treatment is indicated for blood  culture contaminants. CRITICAL RESULT CALLED TO, READ BACK BY AND VERIFIED WITH: T HANDY RN 1929 01/20/19 A BROWNING    Staphylococcus aureus (BCID) NOT DETECTED NOT DETECTED Final   Methicillin resistance NOT DETECTED NOT DETECTED Final   Streptococcus species NOT DETECTED NOT DETECTED Final   Streptococcus agalactiae NOT DETECTED NOT DETECTED Final   Streptococcus pneumoniae NOT DETECTED NOT DETECTED Final   Streptococcus pyogenes NOT DETECTED NOT DETECTED Final   Acinetobacter baumannii NOT DETECTED NOT DETECTED Final   Enterobacteriaceae species NOT DETECTED NOT DETECTED Final   Enterobacter cloacae complex NOT DETECTED NOT DETECTED Final   Escherichia coli NOT DETECTED NOT DETECTED Final   Klebsiella oxytoca NOT DETECTED NOT DETECTED Final   Klebsiella pneumoniae NOT DETECTED NOT DETECTED Final   Proteus species NOT DETECTED NOT DETECTED Final   Serratia marcescens NOT DETECTED NOT DETECTED Final   Haemophilus influenzae NOT DETECTED NOT DETECTED Final    Neisseria meningitidis NOT DETECTED NOT DETECTED Final   Pseudomonas aeruginosa NOT DETECTED NOT DETECTED Final   Candida albicans NOT DETECTED NOT DETECTED Final   Candida glabrata NOT DETECTED NOT DETECTED Final   Candida krusei NOT DETECTED NOT DETECTED Final   Candida parapsilosis NOT DETECTED NOT DETECTED Final   Candida tropicalis NOT DETECTED NOT DETECTED Final    Comment: Performed at [facility], [place]. [address] [place], [place] [address]  Culture, blood (Routine X 2) w Reflex to ID Panel     Status: Abnormal   Collection Time: 01/19/19  7:47 PM  Result Value Ref Range Status   Specimen Description   Final    BLOOD RIGHT HAND Performed at [facility], [address] [place], [place] [address]    Special Requests   Final    BOTTLES DRAWN AEROBIC ONLY Blood Culture adequate volume Performed at  [place] [place], [place] [address]    Culture  Setup Time   Final    GRAM POSITIVE COCCI AEROBIC BOTTLE ONLY CRITICAL VALUE NOTED.  VALUE IS CONSISTENT WITH PREVIOUSLY REPORTED AND CALLED VALUE. GRAM STAIN REVIEWED-AGREE WITH RESULT T. TYSOR    Culture (A)  Final    STAPHYLOCOCCUS LUGDUNENSIS SUSCEPTIBILITIES PERFORMED ON PREVIOUS CULTURE WITHIN THE LAST 5 DAYS. Performed at [facility], [place] [address] [place], [place] [address]    Report Status 01/23/2019 FINAL  Final  MRSA PCR Screening     Status: None   Collection Time: 01/20/19  3:35 AM  Result Value Ref Range Status   MRSA by PCR NEGATIVE NEGATIVE Final    Comment:        The GeneXpert MRSA Assay (FDA approved for NASAL specimens only), is one component of a comprehensive MRSA colonization surveillance program. It is not intended to diagnose MRSA infection nor to guide or monitor treatment for MRSA infections. Performed at [facility], [address], [place], [place] [address]   Culture, blood (routine x 2)     Status: None (Preliminary result)   Collection Time: 01/23/19   8:07 AM  Result Value Ref Range Status   Specimen Description   Final    LEFT ANTECUBITAL BOTTLES DRAWN AEROBIC AND ANAEROBIC   Special Requests Blood Culture adequate volume  Final   Culture   Final    NO GROWTH 2 DAYS Performed at [facility], [address], [place], [place] [address]    Report Status PENDING  Incomplete  Culture, blood (routine x 2)     Status: None (Preliminary result)   Collection Time: 01/23/19  8:15 AM  Result Value Ref Range Status   Specimen Description BLOOD LEFT ARM BOTTLES DRAWN AEROBIC AND ANAEROBIC  Final   Special Requests Blood Culture adequate volume  Final   Culture   Final    NO GROWTH 2 DAYS Performed at [facility], [address], [place],  [address]    Report Status PENDING  Incomplete     Time coordinating discharge: 35 minutes  SIGNED:   Rodena Goldmann, DO [facility] 01/25/2019, 12:34 PM  If 7PM-7AM, please contact night-coverage [email or web address] Password [email or web address]

## 2019-01-25 NOTE — Progress Notes (Signed)
Peripherally Inserted Central Catheter/Midline Placement  The IV Nurse has discussed with the patient and/or persons authorized to consent for the patient, the purpose of this procedure and the potential benefits and risks involved with this procedure.  The benefits include less needle sticks, lab draws from the catheter, and the patient may be discharged home with the catheter. Risks include, but not limited to, infection, bleeding, blood clot (thrombus formation), and puncture of an artery; nerve damage and irregular heartbeat and possibility to perform a PICC exchange if needed/ordered by physician.  Alternatives to this procedure were also discussed.  Bard Power PICC patient education guide, fact sheet on infection prevention and patient information card has been provided to patient /or left at bedside.    PICC/Midline Placement Documentation  PICC Single Lumen 01/25/19 PICC Right Basilic 36 cm 0 cm (Active)  Indication for Insertion or Continuance of Line Home intravenous therapies (PICC only) 01/25/2019  3:18 PM  Exposed Catheter (cm) 0 cm 01/25/2019  3:18 PM  Site Assessment Clean;Dry;Intact 01/25/2019  3:18 PM  Line Status Flushed;Blood return noted;Saline locked 01/25/2019  3:18 PM  Dressing Type Transparent;Securing device 01/25/2019  3:18 PM  Dressing Status Clean;Dry;Intact;Antimicrobial disc in place 01/25/2019  3:18 PM  Dressing Change Due 02/01/19 01/25/2019  3:18 PM       Terry Barrett 01/25/2019, 3:22 PM

## 2019-01-25 NOTE — Progress Notes (Signed)
Unable to contact wife for PICC insertion consent ,I will try later.

## 2019-01-25 NOTE — Progress Notes (Signed)
Discharge instructions and PICC care reviewed with patient daughters and spouse. Both verbalized understanding of instructions.  Patient discharged home with family in stable condition.

## 2019-01-25 NOTE — Progress Notes (Signed)
PHARMACY CONSULT NOTE FOR:  OUTPATIENT  PARENTERAL ANTIBIOTIC THERAPY (OPAT)  Indication: bacteremia Regimen: Cefazolin 2000 mg IV every 8 hours. End date: 03/08/19  IV antibiotic discharge orders are pended. To discharging provider:  please sign these orders via discharge navigator,  Select New Orders & click on the button choice - Manage This Unsigned Work.     Thank you for allowing pharmacy to be a part of this patient's care.  Tad Moore 01/25/2019, 12:22 PM

## 2019-01-28 LAB — CULTURE, BLOOD (ROUTINE X 2)
Culture: NO GROWTH
Culture: NO GROWTH
Special Requests: ADEQUATE
Special Requests: ADEQUATE

## 2019-06-08 ENCOUNTER — Other Ambulatory Visit (HOSPITAL_COMMUNITY): Payer: Self-pay | Admitting: Cardiology

## 2019-06-08 ENCOUNTER — Other Ambulatory Visit: Payer: Self-pay

## 2019-06-08 ENCOUNTER — Ambulatory Visit (HOSPITAL_COMMUNITY)
Admission: RE | Admit: 2019-06-08 | Discharge: 2019-06-08 | Disposition: A | Discharge status: Home / Self Care | Payer: Medicare Other | Source: Ambulatory Visit | Attending: Cardiovascular Disease | Admitting: Cardiovascular Disease

## 2019-06-08 ENCOUNTER — Other Ambulatory Visit: Payer: Self-pay | Admitting: *Deleted

## 2019-06-08 DIAGNOSIS — I679 Cerebrovascular disease, unspecified: Secondary | ICD-10-CM | POA: Diagnosis present

## 2019-06-08 DIAGNOSIS — I6523 Occlusion and stenosis of bilateral carotid arteries: Secondary | ICD-10-CM

## 2019-06-22 NOTE — Progress Notes (Signed)
Error Terry Barrett   

## 2019-06-23 ENCOUNTER — Telehealth: Payer: Medicare Other | Admitting: Cardiology

## 2019-09-14 NOTE — Progress Notes (Signed)
HPI: FU CAD/AVR; coronary artery bypass grafting x3 as well as aortic valve replacement with a pericardial tissue valve in May of 2010. Abdominal ultrasound in July of 2013 showed no aneurysm. Last echocardiogram in July 2014 showed normal LV function and a normally functioning prosthetic aortic valve with a mean gradient of 11 mm of mercury; mild RAE/RVE.  Echocardiogram April 2020 showed normal LV function, grade 2 diastolic dysfunction, bioprosthetic aortic valve with elevated gradients secondary to high cardiac output.  Carotid dopplers 8/20showed 40-59 right and left. Since I last saw him  patient denies dyspnea, chest pain, palpitations or syncope.  Current Outpatient Medications  Medication Sig Dispense Refill  . aspirin 81 MG tablet Take 1 tablet (81 mg total) by mouth daily.    . cetirizine (ZYRTEC) 10 MG tablet Take 10 mg by mouth daily.    Marland Kitchen docusate sodium (COLACE) 100 MG capsule Take 100 mg by mouth daily as needed for mild constipation.    Marland Kitchen donepezil (ARICEPT) 5 MG tablet Take 5 mg by mouth daily.    . feeding supplement, ENSURE ENLIVE, (ENSURE ENLIVE) LIQD Take 237 mLs by mouth 2 (two) times daily between meals. 237 mL 12  . lisinopril-hydrochlorothiazide (PRINZIDE,ZESTORETIC) 10-12.5 MG tablet TAKE ONE (1) TABLET EACH DAY    . multivitamin-iron-minerals-folic acid (CENTRUM) chewable tablet Chew 1 tablet by mouth daily.    Marland Kitchen NITROSTAT 0.4 MG SL tablet DISSOLVE 1 TAB UNDER TOUNGE FOR CHEST PAIN. MAY REPEAT EVERY 5 MINUTES FOR 3 DOSES. IF NO RELIEF CALL 911 OR GO TO ER 25 tablet 1  . Omega-3 Fatty Acids (FISH OIL) 1200 MG CAPS Take 1,200 mg by mouth daily.     . Probiotic Product (PROBIOTIC PO) Take by mouth.    . simvastatin (ZOCOR) 40 MG tablet Take 20 mg by mouth daily.      No current facility-administered medications for this visit.      Past Medical History:  Diagnosis Date  . Aortic stenosis 01/22/2019  . CAD (coronary artery disease) 01/22/2019   5/10,  myoview: mod inferapical ischemia; 03/07/09 cath 3vd, EF 65%, 30-35 mmHg peak to Ao Valve gradient;  . Chronic diastolic heart failure (HCC) 01/22/2019  . Essential hypertension, benign 01/22/2019  . Hyperlipidemia 01/22/2019    Past Surgical History:  Procedure Laterality Date  . AORTIC VALVE REPLACEMENT  03/14/2009   using a 23 mm Edwards pericardial valve  . BACK SURGERY    . CARDIAC CATHETERIZATION  03/07/2009   3vd. EF 65%, 30-35 mmHm peak Ao Valve gradient  . CHOLECYSTECTOMY    . CORONARY ARTERY BYPASS GRAFT  03/14/2009   x3, LIMA - LAD, SVG - Diag, SVG - RCA  . HERNIA REPAIR  01/22/2019   x3    Social History   Socioeconomic History  . Marital status: Married    Spouse name: Not on file  . Number of children: Not on file  . Years of education: Not on file  . Highest education level: Not on file  Occupational History  . Occupation: Retired    Associate Professor: RETIRED  Social Needs  . Financial resource strain: Not on file  . Food insecurity    Worry: Not on file    Inability: Not on file  . Transportation needs    Medical: Not on file    Non-medical: Not on file  Tobacco Use  . Smoking status: Former Games developer  . Smokeless tobacco: Never Used  . Tobacco comment: Quit in his 67's  Substance and Sexual Activity  . Alcohol use: No    Alcohol/week: 0.0 standard drinks  . Drug use: Not on file  . Sexual activity: Not on file  Lifestyle  . Physical activity    Days per week: Not on file    Minutes per session: Not on file  . Stress: Not on file  Relationships  . Social Herbalist on phone: Not on file    Gets together: Not on file    Attends religious service: Not on file    Active member of club or organization: Not on file    Attends meetings of clubs or organizations: Not on file    Relationship status: Not on file  . Intimate partner violence    Fear of current or ex partner: Not on file    Emotionally abused: Not on file    Physically abused: Not on  file    Forced sexual activity: Not on file  Other Topics Concern  . Not on file  Social History Narrative   Married and lives with wife    Family History  Problem Relation Age of Onset  . Heart failure Mother   . Stroke Father   . Heart attack Brother 18  . Hodgkin's lymphoma Brother     ROS: no fevers or chills, productive cough, hemoptysis, dysphasia, odynophagia, melena, hematochezia, dysuria, hematuria, rash, seizure activity, orthopnea, PND, pedal edema, claudication. Remaining systems are negative.  Physical Exam: Well-developed well-nourished in no acute distress.  Skin is warm and dry.  HEENT is normal.  Neck is supple.  Chest is clear to auscultation with normal expansion.  Cardiovascular exam is regular rate and rhythm.  2/6 systolic murmur left sternal border.  No diastolic murmur. Abdominal exam nontender or distended. No masses palpated. Extremities show no edema. neuro grossly intact  A/P  1 coronary artery disease-patient denies chest pain.  Continue medical therapy with aspirin and statin.  2 prior aortic valve replacement-continue SBE prophylaxis.  3 carotid artery disease-follow-up carotid Dopplers August 2021.  4 hypertension-patient's blood pressure is elevated; however typically controlled.  He will follow this and we will adjust regimen as needed.  5 hyperlipidemia-continue statin.  6 lower extremity edema-felt secondary to venous insufficiency.  Continue compression hose and feet elevation.  Kirk Ruths, MD

## 2019-09-21 ENCOUNTER — Other Ambulatory Visit: Payer: Self-pay

## 2019-09-21 ENCOUNTER — Ambulatory Visit (INDEPENDENT_AMBULATORY_CARE_PROVIDER_SITE_OTHER): Payer: Medicare Other | Admitting: Cardiology

## 2019-09-21 ENCOUNTER — Encounter: Payer: Self-pay | Admitting: Cardiology

## 2019-09-21 VITALS — BP 153/71 | HR 70 | Temp 98.4°F | Ht 68.0 in | Wt 146.0 lb

## 2019-09-21 DIAGNOSIS — I2510 Atherosclerotic heart disease of native coronary artery without angina pectoris: Secondary | ICD-10-CM | POA: Diagnosis not present

## 2019-09-21 DIAGNOSIS — I359 Nonrheumatic aortic valve disorder, unspecified: Secondary | ICD-10-CM | POA: Diagnosis not present

## 2019-09-21 DIAGNOSIS — I6523 Occlusion and stenosis of bilateral carotid arteries: Secondary | ICD-10-CM | POA: Diagnosis not present

## 2019-09-21 DIAGNOSIS — I10 Essential (primary) hypertension: Secondary | ICD-10-CM

## 2019-09-21 DIAGNOSIS — E7800 Pure hypercholesterolemia, unspecified: Secondary | ICD-10-CM

## 2019-09-21 NOTE — Patient Instructions (Signed)
Medication Instructions:  NO CHANGES *If you need a refill on your cardiac medications before your next appointment, please call your pharmacy*  Lab Work: If you have labs (blood work) drawn today and your tests are completely normal, you will receive your results only by: . MyChart Message (if you have MyChart) OR . A paper copy in the mail If you have any lab test that is abnormal or we need to change your treatment, we will call you to review the results.   Follow-Up: At [facility], you and your health needs are our priority.  As part of our continuing mission to provide you with exceptional heart care, we have created designated Provider Care Teams.  These Care Teams include your primary Cardiologist (physician) and Advanced Practice Providers (APPs -  Physician Assistants and Nurse Practitioners) who all work together to provide you with the care you need, when you need it.  Your next appointment:   6 month(s)  The format for your next appointment:   Either In Person or Virtual  Provider:   You may see DR. CRENSHAW  or one of the following Advanced Practice Providers on your designated Care Team:    Luke Kilroy, PA-C  Callie Goodrich, PA-C  Jesse Cleaver, FNP    

## 2020-04-30 NOTE — Progress Notes (Signed)
HPI: FU CAD/AVR; coronary artery bypass grafting x3 as well as aortic valve replacement with a pericardial tissue valve in May of 2010. Abdominal ultrasound in July of 2013 showed no aneurysm. Echocardiogram April 2020 showed normal LV function, grade 2 diastolic dysfunction, bioprosthetic aortic valve with elevated gradients secondary to high cardiac output.  Carotid dopplers 8/20showed 40-59 rightand left. Since I last saw himpatient denies increased dyspnea, chest pain, palpitations or syncope.  He has chronic pedal edema.  Current Outpatient Medications  Medication Sig Dispense Refill  . aspirin 81 MG tablet Take 1 tablet (81 mg total) by mouth daily.    . cetirizine (ZYRTEC) 10 MG tablet Take 10 mg by mouth daily.    Marland Kitchen docusate sodium (COLACE) 100 MG capsule Take 100 mg by mouth daily as needed for mild constipation.    Marland Kitchen donepezil (ARICEPT) 5 MG tablet Take 5 mg by mouth daily.    . feeding supplement, ENSURE ENLIVE, (ENSURE ENLIVE) LIQD Take 237 mLs by mouth 2 (two) times daily between meals. 237 mL 12  . lisinopril-hydrochlorothiazide (PRINZIDE,ZESTORETIC) 10-12.5 MG tablet TAKE ONE (1) TABLET EACH DAY    . multivitamin-iron-minerals-folic acid (CENTRUM) chewable tablet Chew 1 tablet by mouth daily.    Marland Kitchen NITROSTAT 0.4 MG SL tablet DISSOLVE 1 TAB UNDER TOUNGE FOR CHEST PAIN. MAY REPEAT EVERY 5 MINUTES FOR 3 DOSES. IF NO RELIEF CALL 911 OR GO TO ER 25 tablet 1  . Omega-3 Fatty Acids (FISH OIL) 1200 MG CAPS Take 1,200 mg by mouth daily.     . Probiotic Product (PROBIOTIC PO) Take by mouth.    . simvastatin (ZOCOR) 40 MG tablet Take 20 mg by mouth daily.      No current facility-administered medications for this visit.     Past Medical History:  Diagnosis Date  . Aortic stenosis 01/22/2019  . CAD (coronary artery disease) 01/22/2019   5/10, myoview: mod inferapical ischemia; 03/07/09 cath 3vd, EF 65%, 30-35 mmHg peak to Ao Valve gradient;  . Chronic diastolic heart failure  (HCC) 33/54/5625  . Essential hypertension, benign 01/22/2019  . Hyperlipidemia 01/22/2019    Past Surgical History:  Procedure Laterality Date  . AORTIC VALVE REPLACEMENT  03/14/2009   using a 23 mm Edwards pericardial valve  . BACK SURGERY    . CARDIAC CATHETERIZATION  03/07/2009   3vd. EF 65%, 30-35 mmHm peak Ao Valve gradient  . CHOLECYSTECTOMY    . CORONARY ARTERY BYPASS GRAFT  03/14/2009   x3, LIMA - LAD, SVG - Diag, SVG - RCA  . HERNIA REPAIR  01/22/2019   x3    Social History   Socioeconomic History  . Marital status: Married    Spouse name: Not on file  . Number of children: Not on file  . Years of education: Not on file  . Highest education level: Not on file  Occupational History  . Occupation: Retired    Associate Professor: RETIRED  Tobacco Use  . Smoking status: Former Games developer  . Smokeless tobacco: Never Used  . Tobacco comment: Quit in his 89's  Vaping Use  . Vaping Use: Never used  Substance and Sexual Activity  . Alcohol use: No    Alcohol/week: 0.0 standard drinks  . Drug use: Not on file  . Sexual activity: Not on file  Other Topics Concern  . Not on file  Social History Narrative   Married and lives with wife   Social Determinants of Health   Financial Resource Strain:   .  Difficulty of Paying Living Expenses:   Food Insecurity:   . Worried About Programme researcher, broadcasting/film/video in the Last Year:   . Barista in the Last Year:   Transportation Needs:   . Freight forwarder (Medical):   Marland Kitchen Lack of Transportation (Non-Medical):   Physical Activity:   . Days of Exercise per Week:   . Minutes of Exercise per Session:   Stress:   . Feeling of Stress :   Social Connections:   . Frequency of Communication with Friends and Family:   . Frequency of Social Gatherings with Friends and Family:   . Attends Religious Services:   . Active Member of Clubs or Organizations:   . Attends Banker Meetings:   Marland Kitchen Marital Status:   Intimate Partner  Violence:   . Fear of Current or Ex-Partner:   . Emotionally Abused:   Marland Kitchen Physically Abused:   . Sexually Abused:     Family History  Problem Relation Age of Onset  . Heart failure Mother   . Stroke Father   . Heart attack Brother 6  . Hodgkin's lymphoma Brother     ROS: no fevers or chills, productive cough, hemoptysis, dysphasia, odynophagia, melena, hematochezia, dysuria, hematuria, rash, seizure activity, orthopnea, PND, claudication. Remaining systems are negative.  Physical Exam: Well-developed elderly in no acute distress.  Skin is warm and dry.  HEENT is normal.  Neck is supple.  Chest is clear to auscultation with normal expansion.  Cardiovascular exam is regular rate and rhythm. 2/6 systolic murmur Abdominal exam nontender or distended. No masses palpated. Extremities show 1+ edema. neuro grossly intact  ECG-sinus rhythm with PACs, normal axis, no ST changes.  Personally reviewed  A/P  1 coronary artery disease-no chest pain.  Continue aspirin and statin.  2 prior aortic valve replacement-continue SBE prophylaxis.   3 carotid artery disease-will arrange follow-up carotid Dopplers August 2021.  4 hypertension-patient's blood pressure is controlled; continue present meds and follow.  5 hyperlipidemia-continue statin.  6 lower extremity edema-unchanged; felt secondary to venous insufficiency.  Continue compression hose and feet elevation.  Olga Millers, MD

## 2020-05-07 ENCOUNTER — Encounter: Payer: Self-pay | Admitting: Cardiology

## 2020-05-07 ENCOUNTER — Ambulatory Visit (INDEPENDENT_AMBULATORY_CARE_PROVIDER_SITE_OTHER): Payer: Medicare Other | Admitting: Cardiology

## 2020-05-07 ENCOUNTER — Other Ambulatory Visit: Payer: Self-pay

## 2020-05-07 VITALS — BP 122/58 | HR 61 | Ht 68.0 in | Wt 146.0 lb

## 2020-05-07 DIAGNOSIS — I2510 Atherosclerotic heart disease of native coronary artery without angina pectoris: Secondary | ICD-10-CM

## 2020-05-07 DIAGNOSIS — I10 Essential (primary) hypertension: Secondary | ICD-10-CM

## 2020-05-07 DIAGNOSIS — E7800 Pure hypercholesterolemia, unspecified: Secondary | ICD-10-CM | POA: Diagnosis not present

## 2020-05-07 DIAGNOSIS — I359 Nonrheumatic aortic valve disorder, unspecified: Secondary | ICD-10-CM | POA: Diagnosis not present

## 2020-05-07 NOTE — Patient Instructions (Signed)
Medication Instructions:  NO CHANGE *If you need a refill on your cardiac medications before your next appointment, please call your pharmacy*   Lab Work: If you have labs (blood work) drawn today and your tests are completely normal, you will receive your results only by: . MyChart Message (if you have MyChart) OR . A paper copy in the mail If you have any lab test that is abnormal or we need to change your treatment, we will call you to review the results.   Follow-Up: At [facility], you and your health needs are our priority.  As part of our continuing mission to provide you with exceptional heart care, we have created designated Provider Care Teams.  These Care Teams include your primary Cardiologist (physician) and Advanced Practice Providers (APPs -  Physician Assistants and Nurse Practitioners) who all work together to provide you with the care you need, when you need it.  We recommend signing up for the patient portal called "MyChart".  Sign up information is provided on this After Visit Summary.  MyChart is used to connect with patients for Virtual Visits (Telemedicine).  Patients are able to view lab/test results, encounter notes, upcoming appointments, etc.  Non-urgent messages can be sent to your provider as well.   To learn more about what you can do with MyChart, go to [email or web address].    Your next appointment:   12 month(s)  The format for your next appointment:   In Person  Provider:   Brian Crenshaw, MD    

## 2020-06-07 ENCOUNTER — Ambulatory Visit (HOSPITAL_COMMUNITY)
Admission: RE | Admit: 2020-06-07 | Discharge: 2020-06-07 | Disposition: A | Discharge status: Home / Self Care | Payer: Medicare Other | Source: Ambulatory Visit | Attending: Cardiology | Admitting: Cardiology

## 2020-06-07 ENCOUNTER — Other Ambulatory Visit: Payer: Self-pay

## 2020-06-07 ENCOUNTER — Other Ambulatory Visit (HOSPITAL_COMMUNITY): Payer: Self-pay | Admitting: Cardiology

## 2020-06-07 DIAGNOSIS — I6523 Occlusion and stenosis of bilateral carotid arteries: Secondary | ICD-10-CM

## 2020-06-11 ENCOUNTER — Encounter: Payer: Self-pay | Admitting: *Deleted

## 2020-08-16 ENCOUNTER — Emergency Department (HOSPITAL_COMMUNITY): Payer: Medicare Other

## 2020-08-16 ENCOUNTER — Other Ambulatory Visit: Payer: Self-pay

## 2020-08-16 ENCOUNTER — Encounter (HOSPITAL_COMMUNITY): Payer: Self-pay | Admitting: *Deleted

## 2020-08-16 ENCOUNTER — Emergency Department (HOSPITAL_COMMUNITY)
Admission: EM | Admit: 2020-08-16 | Discharge: 2020-08-16 | Disposition: A | Discharge status: Home / Self Care | Payer: Medicare Other | Attending: Emergency Medicine | Admitting: Emergency Medicine

## 2020-08-16 DIAGNOSIS — I5032 Chronic diastolic (congestive) heart failure: Secondary | ICD-10-CM | POA: Insufficient documentation

## 2020-08-16 DIAGNOSIS — Z79899 Other long term (current) drug therapy: Secondary | ICD-10-CM | POA: Insufficient documentation

## 2020-08-16 DIAGNOSIS — N433 Hydrocele, unspecified: Secondary | ICD-10-CM | POA: Insufficient documentation

## 2020-08-16 DIAGNOSIS — I25119 Atherosclerotic heart disease of native coronary artery with unspecified angina pectoris: Secondary | ICD-10-CM | POA: Insufficient documentation

## 2020-08-16 DIAGNOSIS — Z7982 Long term (current) use of aspirin: Secondary | ICD-10-CM | POA: Diagnosis not present

## 2020-08-16 DIAGNOSIS — I110 Hypertensive heart disease with heart failure: Secondary | ICD-10-CM | POA: Insufficient documentation

## 2020-08-16 DIAGNOSIS — N5089 Other specified disorders of the male genital organs: Secondary | ICD-10-CM | POA: Diagnosis present

## 2020-08-16 DIAGNOSIS — Z951 Presence of aortocoronary bypass graft: Secondary | ICD-10-CM | POA: Diagnosis not present

## 2020-08-16 LAB — CBC WITH DIFFERENTIAL/PLATELET
Abs Immature Granulocytes: 0.02 10*3/uL (ref 0.00–0.07)
Basophils Absolute: 0.1 10*3/uL (ref 0.0–0.1)
Basophils Relative: 1 %
Eosinophils Absolute: 0.4 10*3/uL (ref 0.0–0.5)
Eosinophils Relative: 8 %
HCT: 37.2 % — ABNORMAL LOW (ref 39.0–52.0)
Hemoglobin: 12.5 g/dL — ABNORMAL LOW (ref 13.0–17.0)
Immature Granulocytes: 0 %
Lymphocytes Relative: 39 %
Lymphs Abs: 2.1 10*3/uL (ref 0.7–4.0)
MCH: 36.2 pg — ABNORMAL HIGH (ref 26.0–34.0)
MCHC: 33.6 g/dL (ref 30.0–36.0)
MCV: 107.8 fL — ABNORMAL HIGH (ref 80.0–100.0)
Monocytes Absolute: 0.5 10*3/uL (ref 0.1–1.0)
Monocytes Relative: 9 %
Neutro Abs: 2.4 10*3/uL (ref 1.7–7.7)
Neutrophils Relative %: 43 %
Platelets: 152 10*3/uL (ref 150–400)
RBC: 3.45 MIL/uL — ABNORMAL LOW (ref 4.22–5.81)
RDW: 13.2 % (ref 11.5–15.5)
WBC: 5.5 10*3/uL (ref 4.0–10.5)
nRBC: 0 % (ref 0.0–0.2)

## 2020-08-16 LAB — BASIC METABOLIC PANEL
Anion gap: 8 (ref 5–15)
BUN: 24 mg/dL — ABNORMAL HIGH (ref 8–23)
CO2: 29 mmol/L (ref 22–32)
Calcium: 9.1 mg/dL (ref 8.9–10.3)
Chloride: 103 mmol/L (ref 98–111)
Creatinine, Ser: 1.12 mg/dL (ref 0.61–1.24)
GFR, Estimated: >60 mL/min (ref >60)
Glucose, Bld: 92 mg/dL (ref 70–99)
Potassium: 4.3 mmol/L (ref 3.5–5.1)
Sodium: 140 mmol/L (ref 135–145)

## 2020-08-16 NOTE — ED Provider Notes (Signed)
[facility] EMERGENCY DEPARTMENT Provider Note   CSN: [id number] Arrival date & time: 08/16/20  1219     History Chief Complaint  Patient presents with  . Groin Swelling    Terry Barrett is a 86 y.o. male.  HPI      Terry Barrett is a 86 y.o. male, with a history of CAD, chronic diastolic heart failure, HTN, hyperlipidemia, presenting to the ED with testicular swelling for at least the last month. Patient is accompanied by his daughter at the bedside.  After the daughter said that the swelling was present for about a month, patient corrected her NSAID he noticed it "for a while before that."  Daughter states patient was seen by his PCP November 1.  PCP recommended urology follow-up and referral was made.  First available appointment at this particular practice was for December.  Based on this, he states his PCP then recommended he come to the ED to at least obtain basic lab work and ultrasound.  Denies fever/chills, abdominal pain, changes in urination, painful bowel movements, testicular pain, dysuria, hematuria, flank/back pain, or any other complaints.  Past Medical History:  Diagnosis Date  . Aortic stenosis 01/22/2019  . CAD (coronary artery disease) 01/22/2019   5/10, myoview: mod inferapical ischemia; 03/07/09 cath 3vd, EF 65%, 30-35 mmHg peak to Ao Valve gradient;  . Chronic diastolic heart failure (HCC) 01/22/2019  . Essential hypertension, benign 01/22/2019  . Hyperlipidemia 01/22/2019    Patient Active Problem List   Diagnosis Date Noted  . Bacteremia 01/22/2019  . CAP (community acquired pneumonia) 01/20/2019  . Elevated serum creatinine 01/20/2019  . Generalized weakness 01/20/2019  . Dehydration 01/20/2019  . Prerenal azotemia 01/20/2019  . Chronic diastolic CHF (congestive heart failure) (HCC) 01/20/2019  . Acute metabolic encephalopathy 01/19/2019  . Cerebrovascular disease 03/16/2014  . Bruit 04/02/2012  . BRADYCARDIA 09/03/2009  .  HYPERLIPIDEMIA 03/30/2009  . Coronary atherosclerosis 03/30/2009  . Essential hypertension, benign 03/07/2009  . ANGINA PECTORIS 03/07/2009  . Aortic valve disorder 03/07/2009    Past Surgical History:  Procedure Laterality Date  . AORTIC VALVE REPLACEMENT  03/14/2009   using a 23 mm Edwards pericardial valve  . BACK SURGERY    . CARDIAC CATHETERIZATION  03/07/2009   3vd. EF 65%, 30-35 mmHm peak Ao Valve gradient  . CHOLECYSTECTOMY    . CORONARY ARTERY BYPASS GRAFT  03/14/2009   x3, LIMA - LAD, SVG - Diag, SVG - RCA  . HERNIA REPAIR  01/22/2019   x3       Family History  Problem Relation Age of Onset  . Heart failure Mother   . Stroke Father   . Heart attack Brother 58  . Hodgkin's lymphoma Brother     Social History   Tobacco Use  . Smoking status: Never Smoker  . Smokeless tobacco: Never Used  . Tobacco comment: Quit in his 68's  Vaping Use  . Vaping Use: Never used  Substance Use Topics  . Alcohol use: No    Alcohol/week: 0.0 standard drinks  . Drug use: Never    Home Medications Prior to Admission medications   Medication Sig Start Date End Date Taking? Authorizing Provider  aspirin 81 MG tablet Take 1 tablet (81 mg total) by mouth daily. 06/07/18  Yes Lewayne Bunting, MD  cetirizine (ZYRTEC) 10 MG tablet Take 10 mg by mouth daily.   Yes [provider]  docusate sodium (COLACE) 100 MG capsule Take 100 mg by mouth daily as  needed for mild constipation.   Yes [provider]  donepezil (ARICEPT) 5 MG tablet Take 5 mg by mouth daily. 07/02/19  Yes [provider]  feeding supplement, ENSURE ENLIVE, (ENSURE ENLIVE) LIQD Take 237 mLs by mouth 2 (two) times daily between meals. 01/21/19  Yes Shah, Pratik D, DO  lisinopril-hydrochlorothiazide (PRINZIDE,ZESTORETIC) 10-12.5 MG tablet Take 1 tablet by mouth daily.  01/05/18  Yes [provider]  multivitamin-iron-minerals-folic acid (CENTRUM) chewable tablet Chew 1 tablet by mouth  daily.   Yes [provider]  NITROSTAT 0.4 MG SL tablet DISSOLVE 1 TAB UNDER TOUNGE FOR CHEST PAIN. MAY REPEAT EVERY 5 MINUTES FOR 3 DOSES. IF NO RELIEF CALL 911 OR GO TO ER Patient taking differently: Place 0.4 mg under the tongue every 5 (five) minutes as needed.  12/18/16  Yes Lewayne Buntingrenshaw, Brian S, MD  Omega-3 Fatty Acids (FISH OIL) 1200 MG CAPS Take 1,200 mg by mouth daily.    Yes [provider]  Probiotic Product (PROBIOTIC PO) Take by mouth.   Yes [provider]  simvastatin (ZOCOR) 40 MG tablet Take 20 mg by mouth daily.    Yes [provider]    Allergies    Sulfamethoxazole-trimethoprim  Review of Systems   Review of Systems  Constitutional: Negative for chills and fever.  Gastrointestinal: Negative for abdominal pain, blood in stool, constipation, diarrhea, nausea and vomiting.  Genitourinary: Positive for scrotal swelling. Negative for difficulty urinating, dysuria, flank pain, frequency and hematuria.  Musculoskeletal: Negative for back pain.  All other systems reviewed and are negative.   Physical Exam Updated Vital Signs BP (!) 144/48 (BP Location: Left Arm)   Pulse 63   Temp (!) 97.5 F (36.4 C) (Oral)   Resp 18   Ht 5\' 3"  (1.6 m)   Wt 66.2 kg   SpO2 98%   BMI 25.86 kg/m   Physical Exam Vitals and nursing note reviewed. Exam conducted with a chaperone present.  Constitutional:      General: He is not in acute distress.    Appearance: He is well-developed. He is not diaphoretic.  HENT:     Head: Normocephalic and atraumatic.     Mouth/Throat:     Mouth: Mucous membranes are moist.     Pharynx: Oropharynx is clear.  Eyes:     Conjunctiva/sclera: Conjunctivae normal.  Cardiovascular:     Rate and Rhythm: Normal rate and regular rhythm.     Pulses: Normal pulses.          Radial pulses are 2+ on the right side and 2+ on the left side.  Pulmonary:     Effort: Pulmonary effort is normal. No respiratory distress.  Abdominal:      Palpations: Abdomen is soft.     Tenderness: There is no abdominal tenderness. There is no guarding.  Genitourinary:    Testes:        Right: Tenderness not present.        Left: Swelling present. Tenderness not present.     Comments: Swelling to left testicle about the size of a baseball. No noted tenderness or fluctuance.  Unable to fully assess for inguinal hernia, however, it does not seem as though there is hernia extending into the scrotum.  No noted lymphadenopathy.  No perineal pain, swelling, or tenderness. Musculoskeletal:     Cervical back: Neck supple.  Skin:    General: Skin is warm and dry.  Neurological:     Mental Status: He is alert.  Psychiatric:        Mood and Affect: Mood and affect normal.        Speech: Speech normal.        Behavior: Behavior normal.     ED Results / Procedures / Treatments   Labs (all labs ordered are listed, but only abnormal results are displayed) Labs Reviewed  BASIC METABOLIC PANEL - Abnormal; Notable for the following components:      Result Value   BUN 24 (*)    All other components within normal limits  CBC WITH DIFFERENTIAL/PLATELET - Abnormal; Notable for the following components:   RBC 3.45 (*)    Hemoglobin 12.5 (*)    HCT 37.2 (*)    MCV 107.8 (*)    MCH 36.2 (*)    All other components within normal limits  URINALYSIS, ROUTINE W REFLEX MICROSCOPIC   Hemoglobin  Date Value Ref Range Status  08/16/2020 12.5 (L) 13.0 - 17.0 g/dL Final  58/06/9832 82.5 13.0 - 17.0 g/dL Final  05/39/7673 41.9 (L) 13.0 - 17.0 g/dL Final  37/90/2409 73.5 (L) 13.0 - 17.0 g/dL Final    EKG None  Radiology US SCROTUM W/DOPPLER  Result Date: 08/16/2020 CLINICAL DATA:  86 year old male with right greater than left scrotal swelling for 1 month. EXAM: SCROTAL ULTRASOUND DOPPLER ULTRASOUND OF THE TESTICLES TECHNIQUE: Complete ultrasound examination of the testicles, epididymis, and other scrotal structures was performed. Color and  spectral Doppler ultrasound were also utilized to evaluate blood flow to the testicles. COMPARISON:  Scrotal ultrasound 10/04/2010. CT Abdomen and Pelvis 03/22/2015. FINDINGS: Right testicle Measurements: 4.5 x 2.9 x 3.2 cm. No mass or microlithiasis visualized. Left testicle Measurements: 4.7 x 2.7 x 2.5 cm. No mass or microlithiasis visualized. Right epididymis:  Within normal limits. Left epididymis: Not identified, likely obscured due to hydrocele described below. Hydrocele: Large left-side hydrocele, chronic and appears increased compared to 2011. Fairly simple hydrocele fluid with low level internal echoes (image 30). Right side hydrocele seen in 2011 is resolved. Varicocele:  None visualized. Pulsed Doppler interrogation of both testes demonstrates normal low resistance arterial and venous waveforms bilaterally. IMPRESSION: 1. Negative for testicular mass or torsion. 2. Positive for large left hydrocele, chronic but increased since 2011. 3. Right side hydrocele seen in 2011 has resolved. Electronically Signed   By: Odessa Fleming M.D.   On: 08/16/2020 17:01    Procedures Procedures (including critical care time)  Medications Ordered in ED Medications - No data to display  ED Course  I have reviewed the triage vital signs and the nursing notes.  Pertinent labs & imaging results that were available during my care of the patient were reviewed by me and considered in my medical decision making (see chart for details).    MDM Rules/Calculators/A&P                          Patient presents with swelling to left scrotum for at least a month. Patient is nontoxic appearing, afebrile, not tachycardic, not tachypneic, not hypotensive, maintains excellent SPO2 on room air, and is in no apparent distress.   I have reviewed the patient's chart to obtain more information.   I reviewed and interpreted the patient's labs and radiological studies. Ultrasound with evidence of hydrocele. Patient denies any  difficulty urinating.  He urinated right before my evaluation. Urology follow-up recommended.  Patient's daughter recommended referral to urology in the [facility] system.  This was completed. Return precautions discussed.  Patient voices understanding of these instructions, accepts the plan, and is comfortable with discharge.  Findings and plan of care discussed with Bethann Berkshire, MD.   Final Clinical Impression(s) / ED Diagnoses Final diagnoses:  Left hydrocele    Rx / DC Orders ED Discharge Orders    None       Concepcion Living 08/16/20 Erling Conte, MD 08/17/20 2236

## 2020-08-16 NOTE — Discharge Instructions (Signed)
There is evidence of hydrocele on the ultrasound.  Follow-up with urology on this matter.

## 2020-08-16 NOTE — ED Triage Notes (Signed)
Pt c/o scrotal swelling, more on right side x 1 month. Denies pain, dysuria. Pt saw his PCP on Monday and they referred him to urologist but they can't see him until December.

## 2020-08-20 NOTE — Progress Notes (Signed)
Subjective: 1. Encysted hydrocele      Terry Barrett is a 86 yo male who is sent for evaluation of a left hydrocele.  He has had swelling for the last year and has gotten progressively worse.  He has skin irritation from rubbing.  He had a scrotal US that confirmed a simple but large hydrocele.   He is voiding well but has some nocturia.  He has had no UTI's or GU surgery.    ROS:  ROS  Allergies  Allergen Reactions  . Sulfamethoxazole-Trimethoprim Itching    Past Medical History:  Diagnosis Date  . Aortic stenosis 01/22/2019  . CAD (coronary artery disease) 01/22/2019   5/10, myoview: mod inferapical ischemia; 03/07/09 cath 3vd, EF 65%, 30-35 mmHg peak to Ao Valve gradient;  . Chronic diastolic heart failure (HCC) 01/22/2019  . Essential hypertension, benign 01/22/2019  . Hyperlipidemia 01/22/2019    Past Surgical History:  Procedure Laterality Date  . AORTIC VALVE REPLACEMENT  03/14/2009   using a 23 mm Edwards pericardial valve  . BACK SURGERY    . CARDIAC CATHETERIZATION  03/07/2009   3vd. EF 65%, 30-35 mmHm peak Ao Valve gradient  . CHOLECYSTECTOMY    . CORONARY ARTERY BYPASS GRAFT  03/14/2009   x3, LIMA - LAD, SVG - Diag, SVG - RCA  . HERNIA REPAIR  01/22/2019   x3    Social History   Socioeconomic History  . Marital status: Married    Spouse name: Not on file  . Number of children: Not on file  . Years of education: Not on file  . Highest education level: Not on file  Occupational History  . Occupation: Retired    Associate Professormployer: RETIRED  Tobacco Use  . Smoking status: Never Smoker  . Smokeless tobacco: Never Used  . Tobacco comment: Quit in his 5720's  Vaping Use  . Vaping Use: Never used  Substance and Sexual Activity  . Alcohol use: No    Alcohol/week: 0.0 standard drinks  . Drug use: Never  . Sexual activity: Not on file  Other Topics Concern  . Not on file  Social History Narrative   Married and lives with wife   Social Determinants of Health    Financial Resource Strain:   . Difficulty of Paying Living Expenses: Not on file  Food Insecurity:   . Worried About Programme researcher, broadcasting/film/videounning Out of Food in the Last Year: Not on file  . Ran Out of Food in the Last Year: Not on file  Transportation Needs:   . Lack of Transportation (Medical): Not on file  . Lack of Transportation (Non-Medical): Not on file  Physical Activity:   . Days of Exercise per Week: Not on file  . Minutes of Exercise per Session: Not on file  Stress:   . Feeling of Stress : Not on file  Social Connections:   . Frequency of Communication with Friends and Family: Not on file  . Frequency of Social Gatherings with Friends and Family: Not on file  . Attends Religious Services: Not on file  . Active Member of Clubs or Organizations: Not on file  . Attends BankerClub or Organization Meetings: Not on file  . Marital Status: Not on file  Intimate Partner Violence:   . Fear of Current or Ex-Partner: Not on file  . Emotionally Abused: Not on file  . Physically Abused: Not on file  . Sexually Abused: Not on file    Family History  Problem Relation Age of Onset  .  Heart failure Mother   . Stroke Father   . Heart attack Brother 28  . Hodgkin's lymphoma Brother     Anti-infectives: Anti-infectives (From admission, onward)   None      Current Outpatient Medications  Medication Sig Dispense Refill  . aspirin 81 MG tablet Take 1 tablet (81 mg total) by mouth daily.    . cetirizine (ZYRTEC) 10 MG tablet Take 10 mg by mouth daily.    Marland Kitchen docusate sodium (COLACE) 100 MG capsule Take 100 mg by mouth daily as needed for mild constipation.    Marland Kitchen donepezil (ARICEPT) 5 MG tablet Take 5 mg by mouth daily.    . feeding supplement, ENSURE ENLIVE, (ENSURE ENLIVE) LIQD Take 237 mLs by mouth 2 (two) times daily between meals. 237 mL 12  . lisinopril-hydrochlorothiazide (PRINZIDE,ZESTORETIC) 10-12.5 MG tablet Take 1 tablet by mouth daily.     . multivitamin-iron-minerals-folic acid (CENTRUM)  chewable tablet Chew 1 tablet by mouth daily.    Marland Kitchen NITROSTAT 0.4 MG SL tablet DISSOLVE 1 TAB UNDER TOUNGE FOR CHEST PAIN. MAY REPEAT EVERY 5 MINUTES FOR 3 DOSES. IF NO RELIEF CALL 911 OR GO TO ER (Patient taking differently: Place 0.4 mg under the tongue every 5 (five) minutes as needed. ) 25 tablet 1  . Omega-3 Fatty Acids (FISH OIL) 1200 MG CAPS Take 1,200 mg by mouth daily.     . Probiotic Product (PROBIOTIC PO) Take by mouth.    . simvastatin (ZOCOR) 40 MG tablet Take 20 mg by mouth daily.      No current facility-administered medications for this visit.     Objective: Vital signs in last 24 hours: BP 125/62   Pulse 65   Temp 97.9 F (36.6 C)   Ht 5\' 3"  (1.6 m)   Wt 146 lb (66.2 kg)   BMI 25.86 kg/m   Intake/Output from previous day: No intake/output data recorded. Intake/Output this shift: @IOTHISSHIFT @   Physical Exam Vitals reviewed.  Constitutional:      Appearance: Normal appearance.  Cardiovascular:     Rate and Rhythm: Normal rate and regular rhythm.     Heart sounds: Normal heart sounds.  Pulmonary:     Effort: Pulmonary effort is normal.     Breath sounds: Normal breath sounds.  Abdominal:     General: Abdomen is flat.     Palpations: Abdomen is soft.     Hernia: No hernia is present.  Genitourinary:    Comments: Normal phallus with adequate meatus. Scrotum has a moderately large left hydrocele with diffuse cherry red spots. Right testicle and epididymis normal. Musculoskeletal:        General: Normal range of motion.     Right lower leg: Edema present.     Left lower leg: Edema present.  Skin:    General: Skin is warm and dry.  Neurological:     General: No focal deficit present.     Mental Status: He is alert and oriented to person, place, and time.  Psychiatric:        Mood and Affect: Mood normal.        Behavior: Behavior normal.     Lab Results:  Recent Results (from the past 2160 hour(s))  Basic metabolic panel     Status: Abnormal    Collection Time: 08/16/20  4:27 PM  Result Value Ref Range   Sodium 140 135 - 145 mmol/L   Potassium 4.3 3.5 - 5.1 mmol/L   Chloride 103 98 - 111 mmol/L  CO2 29 22 - 32 mmol/L   Glucose, Bld 92 70 - 99 mg/dL    Comment: Glucose reference range applies only to samples taken after fasting for at least 8 hours.   BUN 24 (H) 8 - 23 mg/dL   Creatinine, Ser 3.79 0.61 - 1.24 mg/dL   Calcium 9.1 8.9 - 02.4 mg/dL   GFR, Estimated >09 >73 mL/min    Comment: (NOTE) Calculated using the CKD-EPI Creatinine Equation (2021)    Anion gap 8 5 - 15    Comment: Performed at [facility], [address] [place], [place] [address]  CBC with Differential     Status: Abnormal   Collection Time: 08/16/20  4:27 PM  Result Value Ref Range   WBC 5.5 4.0 - 10.5 K/uL   RBC 3.45 (L) 4.22 - 5.81 MIL/uL   Hemoglobin 12.5 (L) 13.0 - 17.0 g/dL   HCT 24.2 (L) 39 - 52 %   MCV 107.8 (H) 80.0 - 100.0 fL   MCH 36.2 (H) 26.0 - 34.0 pg   MCHC 33.6 30.0 - 36.0 g/dL   RDW 68.3 41.9 - 62.2 %   Platelets 152 150 - 400 K/uL   nRBC 0.0 0.0 - 0.2 %   Neutrophils Relative % 43 %   Neutro Abs 2.4 1.7 - 7.7 K/uL   Lymphocytes Relative 39 %   Lymphs Abs 2.1 0.7 - 4.0 K/uL   Monocytes Relative 9 %   Monocytes Absolute 0.5 0.1 - 1.0 K/uL   Eosinophils Relative 8 %   Eosinophils Absolute 0.4 0.0 - 0.5 K/uL   Basophils Relative 1 %   Basophils Absolute 0.1 0.0 - 0.1 K/uL   Immature Granulocytes 0 %   Abs Immature Granulocytes 0.02 0.00 - 0.07 K/uL    Comment: Performed at [facility], [address], [place], [place] [address]    Studies/Results: CLINICAL DATA:  86 year old male with right greater than left scrotal swelling for 1 month.  EXAM: SCROTAL ULTRASOUND  DOPPLER ULTRASOUND OF THE TESTICLES  TECHNIQUE: Complete ultrasound examination of the testicles, epididymis, and other scrotal structures was performed. Color and spectral Doppler ultrasound were also utilized to evaluate blood flow to  the testicles.  COMPARISON:  Scrotal ultrasound 10/04/2010. CT Abdomen and Pelvis 03/22/2015.  FINDINGS: Right testicle  Measurements: 4.5 x 2.9 x 3.2 cm. No mass or microlithiasis visualized.  Left testicle  Measurements: 4.7 x 2.7 x 2.5 cm. No mass or microlithiasis visualized.  Right epididymis:  Within normal limits.  Left epididymis: Not identified, likely obscured due to hydrocele described below.  Hydrocele: Large left-side hydrocele, chronic and appears increased compared to 2011. Fairly simple hydrocele fluid with low level internal echoes (image 30).  Right side hydrocele seen in 2011 is resolved.  Varicocele:  None visualized.  Pulsed Doppler interrogation of both testes demonstrates normal low resistance arterial and venous waveforms bilaterally.  IMPRESSION: 1. Negative for testicular mass or torsion. 2. Positive for large left hydrocele, chronic but increased since 2011. 3. Right side hydrocele seen in 2011 has resolved.   Assessment/Plan: Left hydrocele.  He is symptomatic and I think it would be reasonable to consider a hydrocelectomy.  I have reviewed the risks in detail and would get him cleared by cardiology.   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   No orders of the defined types were placed in this encounter.    Orders Placed This Encounter  Procedures  . Urinalysis, Routine w reflex microscopic  Return for f/u for a post op check once surgery is scheduled. .    CC: Dr. Early Chars.      Terry Barrett 08/21/2020 [phone]

## 2020-08-21 ENCOUNTER — Ambulatory Visit (INDEPENDENT_AMBULATORY_CARE_PROVIDER_SITE_OTHER): Payer: Medicare Other | Admitting: Urology

## 2020-08-21 ENCOUNTER — Encounter: Payer: Self-pay | Admitting: Urology

## 2020-08-21 ENCOUNTER — Other Ambulatory Visit: Payer: Self-pay

## 2020-08-21 VITALS — BP 125/62 | HR 65 | Temp 97.9°F | Ht 63.0 in | Wt 146.0 lb

## 2020-08-21 DIAGNOSIS — N430 Encysted hydrocele: Secondary | ICD-10-CM | POA: Diagnosis not present

## 2020-08-21 DIAGNOSIS — I6523 Occlusion and stenosis of bilateral carotid arteries: Secondary | ICD-10-CM | POA: Diagnosis not present

## 2020-08-21 LAB — URINALYSIS, ROUTINE W REFLEX MICROSCOPIC
Bilirubin, UA: NEGATIVE
Glucose, UA: NEGATIVE
Leukocytes,UA: NEGATIVE
Nitrite, UA: NEGATIVE
Protein,UA: NEGATIVE
RBC, UA: NEGATIVE
Specific Gravity, UA: 1.025 (ref 1.005–1.030)
Urobilinogen, Ur: 0.2 mg/dL (ref 0.2–1.0)
pH, UA: 5 (ref 5.0–7.5)

## 2020-08-21 NOTE — Progress Notes (Signed)
Urological Symptom Review ° °Patient is experiencing the following symptoms: °Get up at night to urinate ° ° °Review of Systems ° °Gastrointestinal (upper)  : °Negative for upper GI symptoms ° °Gastrointestinal (lower) : °Negative for lower GI symptoms ° °Constitutional : °Negative for symptoms ° °Skin: °Negative for skin symptoms ° °Eyes: °Negative for eye symptoms ° °Ear/Nose/Throat : °Negative for Ear/Nose/Throat symptoms ° °Hematologic/Lymphatic: °Negative for Hematologic/Lymphatic symptoms ° °Cardiovascular : °Leg swelling ° °Respiratory : °Negative for respiratory symptoms ° °Endocrine: °Negative for endocrine symptoms ° °Musculoskeletal: °Negative for musculoskeletal symptoms ° °Neurological: °Negative for neurological symptoms ° °Psychologic: °Negative for psychiatric symptoms °

## 2020-11-03 ENCOUNTER — Encounter (HOSPITAL_COMMUNITY): Payer: Self-pay | Admitting: *Deleted

## 2020-11-03 ENCOUNTER — Observation Stay (HOSPITAL_COMMUNITY)
Admission: EM | Admit: 2020-11-03 | Discharge: 2020-11-04 | Disposition: A | Discharge status: Home / Self Care | Payer: Medicare Other | Attending: Family Medicine | Admitting: Family Medicine

## 2020-11-03 ENCOUNTER — Other Ambulatory Visit: Payer: Self-pay

## 2020-11-03 DIAGNOSIS — I679 Cerebrovascular disease, unspecified: Secondary | ICD-10-CM | POA: Diagnosis not present

## 2020-11-03 DIAGNOSIS — R531 Weakness: Secondary | ICD-10-CM

## 2020-11-03 DIAGNOSIS — Z20822 Contact with and (suspected) exposure to covid-19: Secondary | ICD-10-CM | POA: Insufficient documentation

## 2020-11-03 DIAGNOSIS — I6200 Nontraumatic subdural hemorrhage, unspecified: Principal | ICD-10-CM | POA: Insufficient documentation

## 2020-11-03 DIAGNOSIS — W19XXXA Unspecified fall, initial encounter: Secondary | ICD-10-CM | POA: Diagnosis not present

## 2020-11-03 DIAGNOSIS — R3915 Urgency of urination: Secondary | ICD-10-CM | POA: Diagnosis present

## 2020-11-03 DIAGNOSIS — D539 Nutritional anemia, unspecified: Secondary | ICD-10-CM | POA: Diagnosis not present

## 2020-11-03 DIAGNOSIS — S065X9A Traumatic subdural hemorrhage with loss of consciousness of unspecified duration, initial encounter: Secondary | ICD-10-CM | POA: Diagnosis present

## 2020-11-03 DIAGNOSIS — E785 Hyperlipidemia, unspecified: Secondary | ICD-10-CM | POA: Diagnosis not present

## 2020-11-03 DIAGNOSIS — R4182 Altered mental status, unspecified: Secondary | ICD-10-CM | POA: Diagnosis not present

## 2020-11-03 DIAGNOSIS — I10 Essential (primary) hypertension: Secondary | ICD-10-CM | POA: Diagnosis present

## 2020-11-03 DIAGNOSIS — S065XAA Traumatic subdural hemorrhage with loss of consciousness status unknown, initial encounter: Secondary | ICD-10-CM | POA: Diagnosis present

## 2020-11-03 LAB — URINALYSIS, ROUTINE W REFLEX MICROSCOPIC
Bilirubin Urine: NEGATIVE
Glucose, UA: NEGATIVE mg/dL
Hgb urine dipstick: NEGATIVE
Ketones, ur: NEGATIVE mg/dL
Leukocytes,Ua: NEGATIVE
Nitrite: NEGATIVE
Protein, ur: NEGATIVE mg/dL
Specific Gravity, Urine: 1.02 (ref 1.005–1.030)
pH: 5 (ref 5.0–8.0)

## 2020-11-03 NOTE — ED Triage Notes (Signed)
Pt's family has noted pt has more confusion than usual for a week now. Pt with dementia. Pt voiding more frequently and more urgency noted per family. Denies burning with urination.  Pt more weaker than normal as well. Pt denies pain.

## 2020-11-03 NOTE — ED Provider Notes (Signed)
[facility] EMERGENCY DEPARTMENT Provider Note   CSN: [id number] Arrival date & time: 11/03/20  2202     History Chief Complaint  Patient presents with   Urinary Urgency    Terry Barrett is a 87 y.o. male.  Patient is a 87 year old male presenting with complaints of weakness, urinary frequency, and increased confusion over the past week.  He denies any specific aches, pains, or other complaints.  He denies fevers or chills.  Patient's daughter at bedside states that he has had urinary urgency and incontinence on several occasions during the week.  She has also noticed that he seems less steady and more confused on occasion.  He has had 1 fall but denies having struck his head or injured himself.  The history is provided by the patient.       Past Medical History:  Diagnosis Date   Aortic stenosis 01/22/2019   CAD (coronary artery disease) 01/22/2019   5/10, myoview: mod inferapical ischemia; 03/07/09 cath 3vd, EF 65%, 30-35 mmHg peak to Ao Valve gradient;   Chronic diastolic heart failure (HCC) 01/22/2019   Essential hypertension, benign 01/22/2019   Hyperlipidemia 01/22/2019    Patient Active Problem List   Diagnosis Date Noted   Bacteremia 01/22/2019   CAP (community acquired pneumonia) 01/20/2019   Elevated serum creatinine 01/20/2019   Generalized weakness 01/20/2019   Dehydration 01/20/2019   Prerenal azotemia 01/20/2019   Chronic diastolic CHF (congestive heart failure) (HCC) 01/20/2019   Acute metabolic encephalopathy 01/19/2019   Cerebrovascular disease 03/16/2014   Bruit 04/02/2012   BRADYCARDIA 09/03/2009   HYPERLIPIDEMIA 03/30/2009   Coronary atherosclerosis 03/30/2009   Essential hypertension, benign 03/07/2009   ANGINA PECTORIS 03/07/2009   Aortic valve disorder 03/07/2009    Past Surgical History:  Procedure Laterality Date   AORTIC VALVE REPLACEMENT  03/14/2009   using a 23 mm Edwards pericardial valve   BACK SURGERY      CARDIAC CATHETERIZATION  03/07/2009   3vd. EF 65%, 30-35 mmHm peak Ao Valve gradient   CHOLECYSTECTOMY     CORONARY ARTERY BYPASS GRAFT  03/14/2009   x3, LIMA - LAD, SVG - Diag, SVG - RCA   HERNIA REPAIR  01/22/2019   x3       Family History  Problem Relation Age of Onset   Heart failure Mother    Stroke Father    Heart attack Brother 25   Hodgkin's lymphoma Brother     Social History   Tobacco Use   Smoking status: Never Smoker   Smokeless tobacco: Never Used   Tobacco comment: Quit in his 20's  Vaping Use   Vaping Use: Never used  Substance Use Topics   Alcohol use: No    Alcohol/week: 0.0 standard drinks   Drug use: Never    Home Medications Prior to Admission medications   Medication Sig Start Date End Date Taking? Authorizing Provider  aspirin 81 MG tablet Take 1 tablet (81 mg total) by mouth daily. 06/07/18   Lewayne Bunting, MD  cetirizine (ZYRTEC) 10 MG tablet Take 10 mg by mouth daily.    [provider]  docusate sodium (COLACE) 100 MG capsule Take 100 mg by mouth daily as needed for mild constipation.    [provider]  donepezil (ARICEPT) 5 MG tablet Take 5 mg by mouth daily. 07/02/19   [provider]  feeding supplement, ENSURE ENLIVE, (ENSURE ENLIVE) LIQD Take 237 mLs by mouth 2 (two) times daily between meals. 01/21/19  Sherryll Burger, Pratik D, DO  lisinopril-hydrochlorothiazide (PRINZIDE,ZESTORETIC) 10-12.5 MG tablet Take 1 tablet by mouth daily.  01/05/18   [provider]  multivitamin-iron-minerals-folic acid (CENTRUM) chewable tablet Chew 1 tablet by mouth daily.    [provider]  NITROSTAT 0.4 MG SL tablet DISSOLVE 1 TAB UNDER TOUNGE FOR CHEST PAIN. MAY REPEAT EVERY 5 MINUTES FOR 3 DOSES. IF NO RELIEF CALL 911 OR GO TO ER Patient taking differently: Place 0.4 mg under the tongue every 5 (five) minutes as needed.  12/18/16   Lewayne Bunting, MD  Omega-3 Fatty Acids (FISH OIL) 1200 MG CAPS Take  1,200 mg by mouth daily.     [provider]  Probiotic Product (PROBIOTIC PO) Take by mouth.    [provider]  simvastatin (ZOCOR) 40 MG tablet Take 20 mg by mouth daily.     [provider]    Allergies    Sulfamethoxazole-trimethoprim  Review of Systems   Review of Systems  All other systems reviewed and are negative.   Physical Exam Updated Vital Signs BP 123/64 (BP Location: Left Arm)    Pulse 66    Temp (!) 97.5 F (36.4 C) (Oral)    Resp 18    Ht 5\' 3"  (1.6 m)    Wt 66.2 kg    SpO2 99%    BMI 25.86 kg/m   Physical Exam Vitals and nursing note reviewed.  Constitutional:      General: He is not in acute distress.    Appearance: He is well-developed and well-nourished. He is not diaphoretic.  HENT:     Head: Normocephalic and atraumatic.     Mouth/Throat:     Mouth: Oropharynx is clear and moist.  Cardiovascular:     Rate and Rhythm: Normal rate and regular rhythm.     Heart sounds: No murmur heard. No friction rub.  Pulmonary:     Effort: Pulmonary effort is normal. No respiratory distress.     Breath sounds: Normal breath sounds. No wheezing or rales.  Abdominal:     General: Bowel sounds are normal. There is no distension.     Palpations: Abdomen is soft.     Tenderness: There is no abdominal tenderness.  Musculoskeletal:        General: No edema. Normal range of motion.     Cervical back: Normal range of motion and neck supple.  Skin:    General: Skin is warm and dry.  Neurological:     General: No focal deficit present.     Mental Status: He is alert and oriented to person, place, and time.     Cranial Nerves: No cranial nerve deficit.     Motor: No weakness.     Coordination: Coordination normal.     ED Results / Procedures / Treatments   Labs (all labs ordered are listed, but only abnormal results are displayed) Labs Reviewed  URINE CULTURE  URINALYSIS, ROUTINE W REFLEX MICROSCOPIC  COMPREHENSIVE METABOLIC PANEL  CBC  WITH DIFFERENTIAL/PLATELET  TROPONIN I (HIGH SENSITIVITY)    EKG EKG Interpretation  Date/Time:  Sunday November 04 2020 00:17:37 EST Ventricular Rate:  61 PR Interval:    QRS Duration: 85 QT Interval:  440 QTC Calculation: 444 R Axis:   84 Text Interpretation: Sinus rhythm Supraventricular bigeminy Borderline prolonged PR interval Borderline right axis deviation No significant change since 01/19/2019 Confirmed by 03/21/2019 (Geoffery Lyons) on 11/04/2020 12:43:47 AM   Radiology No results found.  Procedures Procedures (including critical care  time)  Medications Ordered in ED Medications - No data to display  ED Course  I have reviewed the triage vital signs and the nursing notes.  Pertinent labs & imaging results that were available during my care of the patient were reviewed by me and considered in my medical decision making (see chart for details).    MDM Rules/Calculators/A&P  Patient is a 87 year old male brought by his daughter for evaluation of weakness, confusion, urinary incontinence, and one fall earlier this week.  Patient arrives with stable vital signs.  He is awake and alert and answers questions appropriately.  Patient's urinalysis shows no evidence for UTI and laboratory studies are essentially unremarkable.  Patient did have a head CT performed which shows a right-sided subdural hematoma with 9 mm of midline shift.  The above finding was discussed with Sabino Dick from neurosurgery.  He has had a discussion with the family who is not interested in surgical intervention.  Patient will be admitted to [facility] for observation and therapy.  CRITICAL CARE Performed by: Geoffery Lyons Total critical care time: 45 minutes Critical care time was exclusive of separately billable procedures and treating other patients. Critical care was necessary to treat or prevent imminent or life-threatening deterioration. Critical care was time spent personally by me on the  following activities: development of treatment plan with patient and/or surrogate as well as nursing, discussions with consultants, evaluation of patient's response to treatment, examination of patient, obtaining history from patient or surrogate, ordering and performing treatments and interventions, ordering and review of laboratory studies, ordering and review of radiographic studies, pulse oximetry and re-evaluation of patient's condition.   Final Clinical Impression(s) / ED Diagnoses Final diagnoses:  None    Rx / DC Orders ED Discharge Orders    None       Geoffery Lyons, MD 11/04/20 0500

## 2020-11-04 ENCOUNTER — Observation Stay (HOSPITAL_COMMUNITY): Payer: Medicare Other

## 2020-11-04 ENCOUNTER — Emergency Department (HOSPITAL_COMMUNITY): Payer: Medicare Other

## 2020-11-04 ENCOUNTER — Ambulatory Visit: Payer: Self-pay

## 2020-11-04 DIAGNOSIS — I679 Cerebrovascular disease, unspecified: Secondary | ICD-10-CM

## 2020-11-04 DIAGNOSIS — I10 Essential (primary) hypertension: Secondary | ICD-10-CM | POA: Diagnosis not present

## 2020-11-04 DIAGNOSIS — R531 Weakness: Secondary | ICD-10-CM

## 2020-11-04 DIAGNOSIS — S065X9A Traumatic subdural hemorrhage with loss of consciousness of unspecified duration, initial encounter: Secondary | ICD-10-CM

## 2020-11-04 DIAGNOSIS — S065XAA Traumatic subdural hemorrhage with loss of consciousness status unknown, initial encounter: Secondary | ICD-10-CM | POA: Diagnosis present

## 2020-11-04 DIAGNOSIS — E782 Mixed hyperlipidemia: Secondary | ICD-10-CM

## 2020-11-04 DIAGNOSIS — D539 Nutritional anemia, unspecified: Secondary | ICD-10-CM

## 2020-11-04 DIAGNOSIS — I6200 Nontraumatic subdural hemorrhage, unspecified: Secondary | ICD-10-CM | POA: Diagnosis not present

## 2020-11-04 LAB — COMPREHENSIVE METABOLIC PANEL
ALT: 17 U/L (ref 0–44)
AST: 23 U/L (ref 15–41)
Albumin: 3.9 g/dL (ref 3.5–5.0)
Alkaline Phosphatase: 74 U/L (ref 38–126)
Anion gap: 8 (ref 5–15)
BUN: 38 mg/dL — ABNORMAL HIGH (ref 8–23)
CO2: 27 mmol/L (ref 22–32)
Calcium: 9 mg/dL (ref 8.9–10.3)
Chloride: 105 mmol/L (ref 98–111)
Creatinine, Ser: 1.11 mg/dL (ref 0.61–1.24)
GFR, Estimated: >60 mL/min (ref >60)
Glucose, Bld: 105 mg/dL — ABNORMAL HIGH (ref 70–99)
Potassium: 4.1 mmol/L (ref 3.5–5.1)
Sodium: 140 mmol/L (ref 135–145)
Total Bilirubin: 0.8 mg/dL (ref 0.3–1.2)
Total Protein: 6.7 g/dL (ref 6.5–8.1)

## 2020-11-04 LAB — CBC WITH DIFFERENTIAL/PLATELET
Abs Immature Granulocytes: 0.03 10*3/uL (ref 0.00–0.07)
Basophils Absolute: 0 10*3/uL (ref 0.0–0.1)
Basophils Relative: 1 %
Eosinophils Absolute: 0.3 10*3/uL (ref 0.0–0.5)
Eosinophils Relative: 4 %
HCT: 34.4 % — ABNORMAL LOW (ref 39.0–52.0)
Hemoglobin: 11.6 g/dL — ABNORMAL LOW (ref 13.0–17.0)
Immature Granulocytes: 1 %
Lymphocytes Relative: 30 %
Lymphs Abs: 2 10*3/uL (ref 0.7–4.0)
MCH: 36.1 pg — ABNORMAL HIGH (ref 26.0–34.0)
MCHC: 33.7 g/dL (ref 30.0–36.0)
MCV: 107.2 fL — ABNORMAL HIGH (ref 80.0–100.0)
Monocytes Absolute: 0.6 10*3/uL (ref 0.1–1.0)
Monocytes Relative: 9 %
Neutro Abs: 3.7 10*3/uL (ref 1.7–7.7)
Neutrophils Relative %: 55 %
Platelets: 181 10*3/uL (ref 150–400)
RBC: 3.21 MIL/uL — ABNORMAL LOW (ref 4.22–5.81)
RDW: 13.4 % (ref 11.5–15.5)
WBC: 6.5 10*3/uL (ref 4.0–10.5)
nRBC: 0 % (ref 0.0–0.2)

## 2020-11-04 LAB — TROPONIN I (HIGH SENSITIVITY): Troponin I (High Sensitivity): 5 ng/L (ref <18)

## 2020-11-04 LAB — PHOSPHORUS: Phosphorus: 2.4 mg/dL — ABNORMAL LOW (ref 2.5–4.6)

## 2020-11-04 LAB — PROTIME-INR
INR: 1.2 (ref 0.8–1.2)
Prothrombin Time: 14.6 seconds (ref 11.4–15.2)

## 2020-11-04 LAB — MAGNESIUM: Magnesium: 1.6 mg/dL — ABNORMAL LOW (ref 1.7–2.4)

## 2020-11-04 LAB — SARS CORONAVIRUS 2 BY RT PCR (HOSPITAL ORDER, PERFORMED IN ~~LOC~~ HOSPITAL LAB): SARS Coronavirus 2: NEGATIVE

## 2020-11-04 LAB — FOLATE: Folate: 15.7 ng/mL (ref >5.9)

## 2020-11-04 LAB — VITAMIN B12: Vitamin B-12: 1281 pg/mL — ABNORMAL HIGH (ref 180–914)

## 2020-11-04 MED ORDER — ACETAMINOPHEN 650 MG RE SUPP: 650.0000 mg | Freq: Four times a day (QID) | RECTAL | Status: DC | PRN

## 2020-11-04 MED ORDER — ACETAMINOPHEN 325 MG PO TABS: 650.0000 mg | ORAL_TABLET | Freq: Four times a day (QID) | ORAL | Status: DC | PRN

## 2020-11-04 MED ORDER — POTASSIUM PHOSPHATES 15 MMOLE/5ML IV SOLN
30.0000 mmol | Freq: Once | INTRAVENOUS | Status: DC
  Filled 2020-11-04: qty 10

## 2020-11-04 MED ORDER — HALOPERIDOL LACTATE 5 MG/ML IJ SOLN: 2.0000 mg | Freq: Four times a day (QID) | INTRAMUSCULAR | Status: DC | PRN

## 2020-11-04 MED ORDER — MELATONIN 3 MG PO TABS: 6.0000 mg | ORAL_TABLET | Freq: Every day | ORAL | Status: DC

## 2020-11-04 MED ORDER — HALOPERIDOL LACTATE 5 MG/ML IJ SOLN
INTRAMUSCULAR | Status: AC
  Filled 2020-11-04: qty 1

## 2020-11-04 MED ORDER — HALOPERIDOL LACTATE 5 MG/ML IJ SOLN
2.0000 mg | Freq: Once | INTRAMUSCULAR | Status: AC | PRN
  Administered 2020-11-04: 2 mg via INTRAMUSCULAR
  Filled 2020-11-04: qty 1

## 2020-11-04 MED ORDER — LORAZEPAM 2 MG/ML IJ SOLN
2.0000 mg | Freq: Four times a day (QID) | INTRAMUSCULAR | Status: DC | PRN
  Administered 2020-11-04: 2 mg via INTRAVENOUS
  Filled 2020-11-04: qty 1

## 2020-11-04 MED ORDER — MAGNESIUM SULFATE 2 GM/50ML IV SOLN
2.0000 g | Freq: Once | INTRAVENOUS | Status: AC
  Administered 2020-11-04: 2 g via INTRAVENOUS
  Filled 2020-11-04: qty 50

## 2020-11-04 MED ORDER — LORAZEPAM 1 MG PO TABS: 1.0000 mg | ORAL_TABLET | ORAL | 0 refills | Status: AC | PRN

## 2020-11-04 MED ORDER — HALOPERIDOL LACTATE 5 MG/ML IJ SOLN
2.0000 mg | Freq: Once | INTRAMUSCULAR | Status: AC
  Administered 2020-11-04: 2 mg via INTRAMUSCULAR

## 2020-11-04 NOTE — ED Notes (Signed)
Admitting MD at the bedside.  

## 2020-11-04 NOTE — H&P (Signed)
History and Physical  IVERY NANNEY CWC:[id number] DOB: [birth year] DOA: 11/03/2020  Referring physician: Geoffery Lyons, MD PCP: Theodoro Kos, MD  Patient coming from: Home  Chief Complaint: Urinary urgency and confusion  HPI: Terry Barrett is a 87 y.o. male with medical history significant for CAD s/g CABG, CHF, HTN, Aortic stenosis s/p valve replacement, CVA who presents to the emergency department due to more than 1 week of increased weakness, difficulty in ambulation urinary incontinence and confusion.  Patient was unable to provide history due to confusion, history was obtained from daughter at bedside.  He was also reported to have sustained a fall without hitting his head after which symptoms worsened.  It was decided to take patient to the ED for further evaluation due to ongoing and worsening symptoms.  He lives at home with elderly wife.  Patient's granddaughter and husband were also present at bedside.  ED Course:  In the emergency department, temperature was 97.57F, other vital signs are within normal range.  Work-up in the ED showed macrocytic anemia, BUN 38, urinalysis was unimpressive for UTI.  CT of head without contrast showed moderate right subdural hematoma overlying the right cerebral convexity with moderate mass effect resulting in effacement of the right lateral ventricle and 9-10 mm right to left midline shift. Neurosurgery was consulted and he had conversation with the daughter, granddaughter and husband (travel nurses) via phone and it was decided to not pursue surgical intervention as surgery will be far too risky and not in patient's best interest.  Repeat CT 12 hours after first CT recommended.  Hospitalist was asked to admit patient for further evaluation and management.  Review of Systems: This cannot be obtained at this time due to patient's altered mental status  Past Medical History:  Diagnosis Date  . Aortic stenosis 01/22/2019  . CAD (coronary artery  disease) 01/22/2019   5/10, myoview: mod inferapical ischemia; 03/07/09 cath 3vd, EF 65%, 30-35 mmHg peak to Ao Valve gradient;  . Chronic diastolic heart failure (HCC) 01/22/2019  . Essential hypertension, benign 01/22/2019  . Hyperlipidemia 01/22/2019   Past Surgical History:  Procedure Laterality Date  . AORTIC VALVE REPLACEMENT  03/14/2009   using a 23 mm Edwards pericardial valve  . BACK SURGERY    . CARDIAC CATHETERIZATION  03/07/2009   3vd. EF 65%, 30-35 mmHm peak Ao Valve gradient  . CHOLECYSTECTOMY    . CORONARY ARTERY BYPASS GRAFT  03/14/2009   x3, LIMA - LAD, SVG - Diag, SVG - RCA  . HERNIA REPAIR  01/22/2019   x3    Social History:  reports that he has never smoked. He has never used smokeless tobacco. He reports that he does not drink alcohol and does not use drugs.   Allergies  Allergen Reactions  . Sulfamethoxazole-Trimethoprim Itching    Family History  Problem Relation Age of Onset  . Heart failure Mother   . Stroke Father   . Heart attack Brother 102  . Hodgkin's lymphoma Brother     Prior to Admission medications   Medication Sig Start Date End Date Taking? Authorizing Provider  aspirin 81 MG tablet Take 1 tablet (81 mg total) by mouth daily. 06/07/18   Lewayne Bunting, MD  cetirizine (ZYRTEC) 10 MG tablet Take 10 mg by mouth daily.    [provider]  docusate sodium (COLACE) 100 MG capsule Take 100 mg by mouth daily as needed for mild constipation.    [provider]  donepezil (ARICEPT) 5  MG tablet Take 5 mg by mouth daily. 07/02/19   [provider]  feeding supplement, ENSURE ENLIVE, (ENSURE ENLIVE) LIQD Take 237 mLs by mouth 2 (two) times daily between meals. 01/21/19   Sherryll BurgerShah, Pratik D, DO  lisinopril-hydrochlorothiazide (PRINZIDE,ZESTORETIC) 10-12.5 MG tablet Take 1 tablet by mouth daily.  01/05/18   [provider]  multivitamin-iron-minerals-folic acid (CENTRUM) chewable tablet Chew 1 tablet by mouth daily.     [provider]  NITROSTAT 0.4 MG SL tablet DISSOLVE 1 TAB UNDER TOUNGE FOR CHEST PAIN. MAY REPEAT EVERY 5 MINUTES FOR 3 DOSES. IF NO RELIEF CALL 911 OR GO TO ER Patient taking differently: Place 0.4 mg under the tongue every 5 (five) minutes as needed.  12/18/16   Lewayne Buntingrenshaw, Brian S, MD  Omega-3 Fatty Acids (FISH OIL) 1200 MG CAPS Take 1,200 mg by mouth daily.     [provider]  Probiotic Product (PROBIOTIC PO) Take by mouth.    [provider]  simvastatin (ZOCOR) 40 MG tablet Take 20 mg by mouth daily.     [provider]    Physical Exam: BP (!) 155/70 (BP Location: Left Arm)   Pulse (!) 104   Temp (!) 97.5 F (36.4 C) (Oral)   Resp 18   Ht 5\' 3"  (1.6 m)   Wt 66.2 kg   SpO2 94%   BMI 25.86 kg/m   . General: 87 y.o. year-old male disoriented, agitated but in no acute distress.   Marland Kitchen. HEENT: NCAT, EOMI . Neck: Supple, trachea medial . Cardiovascular: Regular rate and rhythm with no rubs or gallops.  No thyromegaly or JVD noted.  No lower extremity edema. 2/4 pulses in all 4 extremities. Marland Kitchen. Respiratory: Clear to auscultation with no wheezes or rales. Good inspiratory effort. . Abdomen: Soft nontender nondistended with normal bowel sounds x4 quadrants. . Muskuloskeletal: No cyanosis, clubbing or edema noted bilaterally . Neuro: Patient was confused and agitated.  Strength 5/5/ x 4, sensation intact . Skin: No ulcerative lesions noted or rashes . Psychiatry: Mood is appropriate for condition and setting          Labs on Admission:  Basic Metabolic Panel: Recent Labs  Lab 11/04/20 0000  NA 140  K 4.1  CL 105  CO2 27  GLUCOSE 105*  BUN 38*  CREATININE 1.11  CALCIUM 9.0   Liver Function Tests: Recent Labs  Lab 11/04/20 0000  AST 23  ALT 17  ALKPHOS 74  BILITOT 0.8  PROT 6.7  ALBUMIN 3.9   No results for input(s): LIPASE, AMYLASE in the last 168 hours. No results for input(s): AMMONIA in the last 168 hours. CBC: Recent Labs  Lab  11/04/20 0000  WBC 6.5  NEUTROABS 3.7  HGB 11.6*  HCT 34.4*  MCV 107.2*  PLT 181   Cardiac Enzymes: No results for input(s): CKTOTAL, CKMB, CKMBINDEX, TROPONINI in the last 168 hours.  BNP (last 3 results) No results for input(s): BNP in the last 8760 hours.  ProBNP (last 3 results) No results for input(s): PROBNP in the last 8760 hours.  CBG: No results for input(s): GLUCAP in the last 168 hours.  Radiological Exams on Admission: DG Chest 2 View  Result Date: 11/04/2020 CLINICAL DATA:  Increasing confusion. EXAM: CHEST - 2 VIEW COMPARISON:  01/19/2019 FINDINGS: Postoperative changes in the mediastinum. Heart size and pulmonary vascularity are normal. Calcified and tortuous aorta. Infiltration or atelectasis in the lung bases. No pleural effusions. No pneumothorax. Degenerative changes in the spine and  shoulders. IMPRESSION: Infiltration or atelectasis in the lung bases. Electronically Signed   By: Burman Nieves M.D.   On: 11/04/2020 00:51   CT Head Wo Contrast  Result Date: 11/04/2020 CLINICAL DATA:  Altered mental status EXAM: CT HEAD WITHOUT CONTRAST TECHNIQUE: Contiguous axial images were obtained from the base of the skull through the vertex without intravenous contrast. COMPARISON:  None. FINDINGS: Brain: Moderate right subdural hematoma overlies the right cerebral convexity measuring up to 16 mm in thickness. The subdural hematoma is of varying attenuation in keeping with probable recurrent hemorrhage. There is moderate mass effect with effacement of the a sulci of the right cerebral hemisphere, effacement of the right lateral ventricle, and approximately 9-10 mm of right to left midline shift. No intraparenchymal or intraventricular hemorrhage identified. There is moderate parenchymal atrophy, commensurate with the patient's age. Moderate periventricular and subcortical white matter changes are present likely reflecting the sequela of small vessel ischemia. No evidence of acute  infarct. No intra or extra-axial mass lesion identified. Cerebellum is unremarkable. Vascular: No asymmetric hyperdense vasculature at the skull base. Skull: Moderate motion artifact. No definite calvarial fracture identified. Sinuses/Orbits: The orbits are unremarkable. Paranasal sinuses are clear. Other: Mastoid air cells and middle ear cavities are clear IMPRESSION: Moderate right subdural hematoma overlying the right cerebral convexity with moderate mass effect resulting in effacement of the right lateral ventricle and 9-10 mm right to left midline shift. These results were called by telephone at the time of interpretation on 11/04/2020 at 1:04 am to provider Geoffery Lyons , who verbally acknowledged these results. Electronically Signed   By: Helyn Numbers MD   On: 11/04/2020 01:10    EKG: I independently viewed the EKG done and my findings are as followed: Sinus rhythm at a rate of 61 bpm  Assessment/Plan Present on Admission: . Subdural hematoma (HCC) . Essential hypertension, benign . Cerebrovascular disease  Principal Problem:   Subdural hematoma (HCC) Active Problems:   Hyperlipidemia   Essential hypertension, benign   Cerebrovascular disease   Generalized weakness   Macrocytic anemia  Acute metabolic encephalopathy in the setting of subdural hematoma CT of head without contrast showed moderate right subdural hematoma overlying the right cerebral convexity with moderate mass effect resulting in effacement of the right lateral ventricle and 9-10 mm right to left midline shift. Neurosurgeon was already consulted by ED physician and family doesn't want to pursue any surgical procedure Haldol 2 mg x 1 was given.  Continue Haldol 2 mg IM every 6 hours as needed for agitation Melatonin 6 mg x 1 was given Soft restraint applied at this time due to agitation Continue fall precaution and neurochecks Continue PT/OT eval and treat CT head without contrast will be repeated this PM as  recommended by Neurosurgery Consider re-consulting with the Neurosurgeon based on CT findings   Macrocytic anemia H/H11.6/34.4, MCV 107.2 Vitamin B12 and folate level will be checked  Essential hypertension Continue home lisinopril -HCTZ  Hyperlipidemia Continue Zocor  History of CVA Continue Zocor; aspirin will be held at this time due to subdural hematoma Continue fall precaution and neurochecks Continue PT/OT eval and treat  DVT prophylaxis: SCDs  Code Status: Full code  Family Communication: Daughter, granddaughter, grandson in Social worker (all questions answered to satisfaction)  Disposition Plan:  Patient is from:                        home Anticipated DC to:  home Anticipated DC date:               1 day Anticipated DC barriers:           Patient is unstable to be discharged at this time due to generalized weakness in the setting of subdural hematoma  Consults called: Neurosurgery (by ED physician)  Admission status: Observation   Frankey Shown MD [facility]  11/04/2020, 2:39 AM

## 2020-11-04 NOTE — Evaluation (Signed)
Physical Therapy Evaluation Patient Details Name: Terry Barrett MRN: [id number] DOB: October 14, 1927 Today's Date: 11/04/2020   History of Present Illness    Silas ADI DORO is a 87 y.o. male with medical history significant for CAD s/g CABG, CHF, HTN, Aortic stenosis s/p valve replacement, CVA who presents to the emergency department due to more than 1 week of increased weakness, difficulty in ambulation urinary incontinence and confusion.  Patient was unable to provide history due to confusion, history was obtained from daughter at bedside.  He was also reported to have sustained a fall without hitting his head after which symptoms worsened.  It was decided to take patient to the ED for further evaluation due to ongoing and worsening symptoms.  He lives at home with elderly wife.  Patient's granddaughter and husband were also present at bedside.   Clinical Impression   Unable to assess at this time due to lethargy and sedation from recent Ativan administration.  Patient's family educated and NSG staff notified and asked to contact therapist if level of arousal changes.    Follow Up Recommendations      Equipment Recommendations       Recommendations for Other Services       Precautions / Restrictions        Mobility  Bed Mobility                    Transfers                    Ambulation/Gait                Stairs            Wheelchair Mobility    Modified Rankin (Stroke Patients Only)       Balance                                             Pertinent Vitals/Pain      Home Living                        Prior Function                 Hand Dominance        Extremity/Trunk Assessment                Communication      Cognition                                              General Comments      Exercises     Assessment/Plan    PT Assessment    PT Problem  List         PT Treatment Interventions      PT Goals (Current goals can be found in the Care Plan section)       Frequency     Barriers to discharge        Co-evaluation               AM-PAC PT "6 Clicks" Mobility  Outcome Measure                  End of Session  Time:  -      Charges:              10:28 AM, 11/04/20 M. Shary Decamp, PT, DPT Physical Therapist- [place] Office Number: [phone]

## 2020-11-04 NOTE — Progress Notes (Signed)
  NEUROSURGERY PROGRESS NOTE   Received call from Dr Judd Lien, EDP [facility], regarding patient. 87 year old, hx of dementia although lives at home with wife, CAD s/g CABG, CHF, HTN, Aortic stenosis s/p valve replacement, CVA. Daughter brought patient in for worsening confusion, urinary incontinence, generalized weakness. As part of work up, a CT head was obtained which revealed a fairly large right holohemispheric SDH with nearly 1cm midline shift. He is on 81mg  ASA daily. In normal circumstances, I would rec craniotomy for evacuation, however, given patient's advanced age and medical comorbidites, I am not sure proceeding with surgery for evacuation is in his best interest. Personally discussed GOC with daughter and her two children (both of whom are travel nurses) via phone. We discussed patient's history, current status and CT findings. We discussed possibility of evacuation of the SDH at length including risks, benefits and alternatives. After our discussion, family has decided to not pursue surgical intervention as surgery would be far too risky and not in his best interest. I believe this is an appropriate decision.  I advised Dr regarding my discussion with family. Plan will be to admit for overnight obs at [facility]. Can repeat a CT later today (in 12 hours) for prognosis purposes although will not change neurosurgical plan of care.   Please call for any concerns. Total time spent coordinating care and discussing with family 30 minutes  Judd Lien, PA-C [facility] [place]

## 2020-11-04 NOTE — Progress Notes (Signed)
Called in for support for family as it was found that Mr Harr has a subdural hematoma.and they will be taking him home today. His family is very appropriate as they offer details of his integrity, his life of faith and commitments with family and friends. The appropriateness of their emotions were expressed in the reality of what his prognosis may be. They were tearful and grateful for the vitality of life they knew with him. They want him to be at home with family and that this is what he would want. Prayed with them for the [facility] of what lies ahead for this loving family and their loved one.

## 2020-11-04 NOTE — TOC Transition Note (Signed)
Transition of Care [facility]) - CM/SW Discharge Note   Patient Details  Name: CANNEN DUPRAS MRN: [id number] Date of Birth: 03/20/1928  [facility]) CM/SW Contact:  Barry Brunner, LCSW Phone Number: 11/04/2020, 2:48 PM   Clinical Narrative:    CSW notified of patient's family request for Home Hospice. Patient's family agreeable to [facility] hospice referral. Patient's family reported that they would need a hospital bed, but that it would not need to be delivered to the home before discharge. CSW placed hospice referral with [facility] hosp[facility]. [facility] agreeable to take patient and schedule equipment for delivery tomorrow. TOC signing off.   Final next level of care: Home w Hospice Care Barriers to Discharge: Barriers Resolved   Patient Goals and CMS Choice Patient states their goals for this hospitalization and ongoing recovery are:: Return home with hospice CMS Medicare.gov Compare Post Acute Care list provided to:: Patient Choice offered to / list presented to : Patient  Discharge Placement                Patient to be transferred to facility by: [facility] Name of family member notified: Hutcherson,Deleia Patient and family notified of of transfer: 11/04/20  Discharge Plan and Services                DME Arranged: N/A DME Agency: NA       HH Arranged: NA HH Agency: NA        Social Determinants of Health (SDOH) Interventions     Readmission Risk Interventions No flowsheet data found.

## 2020-11-04 NOTE — Discharge Summary (Signed)
Physician Discharge Summary  Terry BinetJimmie F Hoaglin ONG:[id number] DOB: 02-02-1928 DOA: 11/03/2020  PCP: Theodoro KosLewis, William B, MD  Admit date: 11/03/2020 Discharge date: 11/04/2020    Admitted From: Home Disposition: Home  Recommendations for Outpatient Follow-up:  1. Follow up with PCP in 1-2 weeks 2. Please obtain BMP/CBC in one week 3. Please follow up with your PCP on the following pending results: Unresulted Labs (From admission, onward)          Start     Ordered   11/05/20 0500  Comprehensive metabolic panel  Tomorrow morning,   R        11/04/20 0329   11/05/20 0500  CBC  Tomorrow morning,   R        11/04/20 0329   11/05/20 0500  Protime-INR  Tomorrow morning,   R        11/04/20 0329   11/05/20 0500  APTT  Tomorrow morning,   R        11/04/20 0329   11/03/20 2225  Urine culture  ONCE - STAT,   STAT        11/03/20 2224            Home Health: None Equipment/Devices: None  Discharge Condition: Poor  CODE STATUS: DNR Diet recommendation: Regular  Subjective: Seen and examined this morning.  Daughter and granddaughter at the bedside.  Patient was slightly lethargic but totally confused and picking up on things and pulling his sheets.  HPI: Terry Barrett is a 87 y.o. male with medical history significant for CAD s/g CABG, CHF, HTN, Aortic stenosis s/p valve replacement, CVA who presents to the emergency department due to more than 1 week of increased weakness, difficulty in ambulation urinary incontinence and confusion.  Patient was unable to provide history due to confusion, history was obtained from daughter at bedside.  He was also reported to have sustained a fall without hitting his head after which symptoms worsened.  It was decided to take patient to the ED for further evaluation due to ongoing and worsening symptoms.  He lives at home with elderly wife.  Patient's granddaughter and husband were also present at bedside.  ED Course:  In the emergency department,  temperature was 97.26F, other vital signs are within normal range.  Work-up in the ED showed macrocytic anemia, BUN 38, urinalysis was unimpressive for UTI.  CT of head without contrast showed moderate right subdural hematoma overlying the right cerebral convexity with moderate mass effect resulting in effacement of the right lateral ventricle and 9-10 mm right to left midline shift. Neurosurgery was consulted and he had conversation with the daughter, granddaughter and husband (travel nurses) via phone and it was decided to not pursue surgical intervention as surgery will be far too risky and not in patient's best interest.  Repeat CT 12 hours after first CT recommended.  Hospitalist was asked to admit patient for further evaluation and management.  Brief/Interim Summary: Patient was admitted under hospitalist service due to acute encephalopathy secondary to subdural hematoma as well as generalized weakness and falls.  As mentioned above, the case was discussed with neurosurgery on-call who had discussed with the family and family declined any surgical option.  However they were willing to have a repeat CT of the head as recommended by the neurosurgery.  Repeat CT of the head showed worsening subdural hematoma and worsening midline shift.  I had a long discussion with the family this morning before the CT scan results and after  CT scan results as well.  They are very clear with the plan that they would like to take the patient home as they believe that patient would be more comfortable at home.  Social worker was informed this morning and has made a referral for outpatient palliative care/hospice.  Hospice to call family tomorrow and set up a bed at hospice facility.  Aspirin to be discontinued but resume rest of the medications.  Per family's request, I have prescribed him 15 tablets of Ativan to control agitation at home if patient is able to take.  Patient's daughter has reassured me that patient's  granddaughter and her son both are nurses by profession and will be available with the patient.  Discharge Diagnoses:  Principal Problem:   Subdural hematoma (HCC) Active Problems:   Hyperlipidemia   Essential hypertension, benign   Cerebrovascular disease   Generalized weakness   Macrocytic anemia    Discharge Instructions   Allergies as of 11/04/2020      Reactions   Sulfamethoxazole-trimethoprim Itching      Medication List    STOP taking these medications   aspirin 81 MG tablet     TAKE these medications   cetirizine 10 MG tablet Commonly known as: ZYRTEC Take 10 mg by mouth daily.   docusate sodium 100 MG capsule Commonly known as: COLACE Take 100 mg by mouth daily as needed for mild constipation.   donepezil 5 MG tablet Commonly known as: ARICEPT Take 5 mg by mouth daily.   feeding supplement Liqd Take 237 mLs by mouth 2 (two) times daily between meals.   Fish Oil 1200 MG Caps Take 1,200 mg by mouth daily.   lisinopril-hydrochlorothiazide 10-12.5 MG tablet Commonly known as: ZESTORETIC Take 1 tablet by mouth daily.   LORazepam 1 MG tablet Commonly known as: Ativan Take 1 tablet (1 mg total) by mouth every 4 (four) hours as needed (AGITATION).   multivitamin-iron-minerals-folic acid chewable tablet Chew 1 tablet by mouth daily.   Nitrostat 0.4 MG SL tablet Generic drug: nitroGLYCERIN DISSOLVE 1 TAB UNDER TOUNGE FOR CHEST PAIN. MAY REPEAT EVERY 5 MINUTES FOR 3 DOSES. IF NO RELIEF CALL 911 OR GO TO ER What changed: See the new instructions.   PROBIOTIC PO Take by mouth.   simvastatin 40 MG tablet Commonly known as: ZOCOR Take 20 mg by mouth daily.   SYSTANE OP Apply 1 drop to eye daily as needed (dry eye).       Allergies  Allergen Reactions  . Sulfamethoxazole-Trimethoprim Itching    Consultations: Neurosurgery   Procedures/Studies: DG Chest 2 View  Result Date: 11/04/2020 CLINICAL DATA:  Increasing confusion. EXAM: CHEST - 2  VIEW COMPARISON:  01/19/2019 FINDINGS: Postoperative changes in the mediastinum. Heart size and pulmonary vascularity are normal. Calcified and tortuous aorta. Infiltration or atelectasis in the lung bases. No pleural effusions. No pneumothorax. Degenerative changes in the spine and shoulders. IMPRESSION: Infiltration or atelectasis in the lung bases. Electronically Signed   By: Burman NievesWilliam  Stevens M.D.   On: 11/04/2020 00:51   CT HEAD WO CONTRAST  Result Date: 11/04/2020 CLINICAL DATA:  Subdural hematoma EXAM: CT HEAD WITHOUT CONTRAST TECHNIQUE: Contiguous axial images were obtained from the base of the skull through the vertex without intravenous contrast. COMPARISON:  11/04/2020 FINDINGS: Brain: Redemonstration of mixed density right cerebral convexity subdural collection measuring up to 1.8 cm (2:18, previously 1.7 cm). Leftward midline shift of 1.1 cm, slightly increased since prior exam (previously 0.9 cm). Unchanged effacement of the right cerebral  sulci and right lateral ventricle. Left cerebral white matter hypodensities are unchanged. No intraventricular hemorrhage. Vascular: No hyperdense vessel or unexpected calcification. Bilateral skull base atherosclerotic calcifications. Skull: No acute finding. Sinuses/Orbits: No acute orbital finding. Pneumatized paranasal sinuses and mastoid air cells. Other: None. IMPRESSION: Right cerebral convexity subdural hematoma, slightly increased in size since prior exam. Slightly increased leftward midline shift of 1.1 cm. These results will be called to the ordering clinician or representative by the Radiologist Assistant, and communication documented in the PACS or [employer]. Electronically Signed   By: Stana Bunting M.D.   On: 11/04/2020 12:24   CT Head Wo Contrast  Result Date: 11/04/2020 CLINICAL DATA:  Altered mental status EXAM: CT HEAD WITHOUT CONTRAST TECHNIQUE: Contiguous axial images were obtained from the base of the skull through the vertex  without intravenous contrast. COMPARISON:  None. FINDINGS: Brain: Moderate right subdural hematoma overlies the right cerebral convexity measuring up to 16 mm in thickness. The subdural hematoma is of varying attenuation in keeping with probable recurrent hemorrhage. There is moderate mass effect with effacement of the a sulci of the right cerebral hemisphere, effacement of the right lateral ventricle, and approximately 9-10 mm of right to left midline shift. No intraparenchymal or intraventricular hemorrhage identified. There is moderate parenchymal atrophy, commensurate with the patient's age. Moderate periventricular and subcortical white matter changes are present likely reflecting the sequela of small vessel ischemia. No evidence of acute infarct. No intra or extra-axial mass lesion identified. Cerebellum is unremarkable. Vascular: No asymmetric hyperdense vasculature at the skull base. Skull: Moderate motion artifact. No definite calvarial fracture identified. Sinuses/Orbits: The orbits are unremarkable. Paranasal sinuses are clear. Other: Mastoid air cells and middle ear cavities are clear IMPRESSION: Moderate right subdural hematoma overlying the right cerebral convexity with moderate mass effect resulting in effacement of the right lateral ventricle and 9-10 mm right to left midline shift. These results were called by telephone at the time of interpretation on 11/04/2020 at 1:04 am to provider Geoffery Lyons , who verbally acknowledged these results. Electronically Signed   By: Helyn Numbers MD   On: 11/04/2020 01:10      Discharge Exam: Vitals:   11/04/20 0730 11/04/20 1235  BP: 139/79 (!) 165/82  Pulse: 98 76  Resp: 20 18  Temp: 97.9 F (36.6 C) 98 F (36.7 C)  SpO2: 97% 99%   Vitals:   11/04/20 0230 11/04/20 0625 11/04/20 0730 11/04/20 1235  BP: 122/73 138/76 139/79 (!) 165/82  Pulse:   98 76  Resp:  18 20 18   Temp:   97.9 F (36.6 C) 98 F (36.7 C)  TempSrc:   Oral Oral  SpO2:    97% 99%  Weight:      Height:        General: Pt is alert, slightly lethargic but totally confused Cardiovascular: RRR, S1/S2 +, no rubs, no gallops Respiratory: CTA bilaterally, no wheezing, no rhonchi Abdominal: Soft, NT, ND, bowel sounds + Extremities: no edema, no cyanosis    The results of significant diagnostics from this hospitalization (including imaging, microbiology, ancillary and laboratory) are listed below for reference.     Microbiology: Recent Results (from the past 240 hour(s))  SARS Coronavirus 2 by RT PCR (hospital order, performed in [facility] hospital lab) Nasopharyngeal Nasopharyngeal Swab     Status: None   Collection Time: 11/04/20  1:27 AM   Specimen: Nasopharyngeal Swab  Result Value Ref Range Status   SARS Coronavirus 2 NEGATIVE NEGATIVE Final  Comment: (NOTE) SARS-CoV-2 target nucleic acids are NOT DETECTED.  The SARS-CoV-2 RNA is generally detectable in upper and lower respiratory specimens during the acute phase of infection. The lowest concentration of SARS-CoV-2 viral copies this assay can detect is 250 copies / mL. A negative result does not preclude SARS-CoV-2 infection and should not be used as the sole basis for treatment or other patient management decisions.  A negative result may occur with improper specimen collection / handling, submission of specimen other than nasopharyngeal swab, presence of viral mutation(s) within the areas targeted by this assay, and inadequate number of viral copies (<250 copies / mL). A negative result must be combined with clinical observations, patient history, and epidemiological information.  Fact Sheet for Patients:   BoilerBrush.com.cy  Fact Sheet for Healthcare Providers: https://pope.com/  This test is not yet approved or  cleared by the [place] FDA and has been authorized for detection and/or diagnosis of SARS-CoV-2 by FDA under an Emergency  Use Authorization (EUA).  This EUA will remain in effect (meaning this test can be used) for the duration of the COVID-19 declaration under Section 564(b)(1) of the Act, 21 U.S.C. section 360bbb-3(b)(1), unless the authorization is terminated or revoked sooner.  Performed at [facility], [address], [place], [place] [address]      Labs: BNP (last 3 results) No results for input(s): BNP in the last 8760 hours. Basic Metabolic Panel: Recent Labs  Lab 11/04/20 0000 11/04/20 0656  NA 140  --   K 4.1  --   CL 105  --   CO2 27  --   GLUCOSE 105*  --   BUN 38*  --   CREATININE 1.11  --   CALCIUM 9.0  --   MG  --  1.6*  PHOS  --  2.4*   Liver Function Tests: Recent Labs  Lab 11/04/20 0000  AST 23  ALT 17  ALKPHOS 74  BILITOT 0.8  PROT 6.7  ALBUMIN 3.9   No results for input(s): LIPASE, AMYLASE in the last 168 hours. No results for input(s): AMMONIA in the last 168 hours. CBC: Recent Labs  Lab 11/04/20 0000  WBC 6.5  NEUTROABS 3.7  HGB 11.6*  HCT 34.4*  MCV 107.2*  PLT 181   Cardiac Enzymes: No results for input(s): CKTOTAL, CKMB, CKMBINDEX, TROPONINI in the last 168 hours. BNP: Invalid input(s): POCBNP CBG: No results for input(s): GLUCAP in the last 168 hours. D-Dimer No results for input(s): DDIMER in the last 72 hours. Hgb A1c No results for input(s): HGBA1C in the last 72 hours. Lipid Profile No results for input(s): CHOL, HDL, LDLCALC, TRIG, CHOLHDL, LDLDIRECT in the last 72 hours. Thyroid function studies No results for input(s): TSH, T4TOTAL, T3FREE, THYROIDAB in the last 72 hours.  Invalid input(s): FREET3 Anemia work up Recent Labs    11/04/20 0656  VITAMINB12 1,281*  FOLATE 15.7   Urinalysis    Component Value Date/Time   COLORURINE YELLOW 11/03/2020 2225   APPEARANCEUR CLEAR 11/03/2020 2225   APPEARANCEUR Clear 08/21/2020 1511   LABSPEC 1.020 11/03/2020 2225   PHURINE 5.0 11/03/2020 2225   GLUCOSEU NEGATIVE 11/03/2020 2225    HGBUR NEGATIVE 11/03/2020 2225   BILIRUBINUR NEGATIVE 11/03/2020 2225   BILIRUBINUR Negative 08/21/2020 1511   KETONESUR NEGATIVE 11/03/2020 2225   PROTEINUR NEGATIVE 11/03/2020 2225   UROBILINOGEN 1.0 03/22/2015 1903   NITRITE NEGATIVE 11/03/2020 2225   LEUKOCYTESUR NEGATIVE 11/03/2020 2225   Sepsis Labs Invalid input(s): PROCALCITONIN,  WBC,  LACTICIDVEN Microbiology Recent Results (from the past 240 hour(s))  SARS Coronavirus 2 by RT PCR (hospital order, performed in [facility] hospital lab) Nasopharyngeal Nasopharyngeal Swab     Status: None   Collection Time: 11/04/20  1:27 AM   Specimen: Nasopharyngeal Swab  Result Value Ref Range Status   SARS Coronavirus 2 NEGATIVE NEGATIVE Final    Comment: (NOTE) SARS-CoV-2 target nucleic acids are NOT DETECTED.  The SARS-CoV-2 RNA is generally detectable in upper and lower respiratory specimens during the acute phase of infection. The lowest concentration of SARS-CoV-2 viral copies this assay can detect is 250 copies / mL. A negative result does not preclude SARS-CoV-2 infection and should not be used as the sole basis for treatment or other patient management decisions.  A negative result may occur with improper specimen collection / handling, submission of specimen other than nasopharyngeal swab, presence of viral mutation(s) within the areas targeted by this assay, and inadequate number of viral copies (<250 copies / mL). A negative result must be combined with clinical observations, patient history, and epidemiological information.  Fact Sheet for Patients:   BoilerBrush.com.cy  Fact Sheet for Healthcare Providers: https://pope.com/  This test is not yet approved or  cleared by the [place] FDA and has been authorized for detection and/or diagnosis of SARS-CoV-2 by FDA under an Emergency Use Authorization (EUA).  This EUA will remain in effect (meaning this test can be used)  for the duration of the COVID-19 declaration under Section 564(b)(1) of the Act, 21 U.S.C. section 360bbb-3(b)(1), unless the authorization is terminated or revoked sooner.  Performed at [facility], [address] [place], [place] [address]      Time coordinating discharge: Over 30 minutes  SIGNED:   Hughie Closs, MD  [facility] 11/04/2020, 12:37 PM  If 7PM-7AM, please contact night-coverage [email or web address]

## 2020-11-04 NOTE — ED Notes (Signed)
MD at the bedside with family & pt.

## 2020-11-05 LAB — URINE CULTURE: Culture: NO GROWTH

## 2020-11-13 DEATH — deceased

## 2020-11-15 ENCOUNTER — Ambulatory Visit: Payer: Medicare Other | Admitting: Urology

## 2020-11-22 ENCOUNTER — Ambulatory Visit: Payer: Medicare Other | Admitting: Urology
